# Patient Record
Sex: Female | Born: 1975
Health system: Southern US, Community
[De-identification: ages and names within clinical notes are randomized; demographics above are authoritative.]

---

## 2011-05-29 ENCOUNTER — Ambulatory Visit (INDEPENDENT_AMBULATORY_CARE_PROVIDER_SITE_OTHER): Payer: PRIVATE HEALTH INSURANCE | Admitting: Sports Medicine

## 2011-05-29 VITALS — BP 120/70 | Ht 64.0 in | Wt 125.0 lb

## 2011-05-29 DIAGNOSIS — M722 Plantar fascial fibromatosis: Secondary | ICD-10-CM | POA: Insufficient documentation

## 2011-05-29 NOTE — Assessment & Plan Note (Signed)
We will start her on exercises stretches and icing  Continue using the custom orthotics that were made for her as they look to be in good shape  use an arch strap  Restart activity but do this at about 50-60%  Level  Recheck in 2 mos

## 2011-05-29 NOTE — Progress Notes (Signed)
  Subjective:    Patient ID: Virginia Horne, female    DOB: 07/01/1975, 36 y.o.   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  She went to doctor 07/2010 who did steroid injection and put her in cam walker for 8 weeks.  She restarted zumba gradually with OTC insoles and heel pain returned.  Went back to Dr. Karle Barr 11/2010 did another CSI and 8 more weeks in cam walker.  Had custom orthotics made 12/2010- saw improvement initially, but now progressively worsening since late November of 2012.  She comes to see [place] for another opinion. She would like to get back to activity. She does not notice pain with horseback riding but does with walking up hills. She does not have much pain with regular walking during the day.   Review of Systems     Objective:   Physical Exam No acute distress. Normal weight and looks to be physically fit Neutral arch Slight splaying of toes 1-2, widening of forefoot AT normal bilat Good posterior tib function Leg lengths equal  Alignment good Good great toe motion bilat TTP on lt heel  Good abduction and rotation strength on lt Hip flexion strength good on lt Neutral walking gait  MSK ultrasound  The left plantar fascia shows a small spur There is hypoechoic change around this that is seen on both long and transverse view suggesting that it may be a partial tear of the fascia The left fascia is 0.67 thick versus 0.33 on the right The plantar fascia remains thick for 2 cm distal to the insertion        Assessment & Plan:

## 2011-05-29 NOTE — Patient Instructions (Signed)
Do PF stretches 2-3 times per day  Do heel raises 3 sets of 15 reps with knees straight and knees bent- twice daily  Ice baths are very good for the plantar fascia - try to do this for a total of 20 minutes per day  It is ok to start back to your normal activities at 1/2 intensity and time- if you are ok with this it is ok to gradually increase   Please avoid activities that require vigorous jumping on lt heel  Please follow up in 2 months  Thank you for seeing [place] today!

## 2011-07-26 ENCOUNTER — Ambulatory Visit (INDEPENDENT_AMBULATORY_CARE_PROVIDER_SITE_OTHER): Payer: PRIVATE HEALTH INSURANCE | Admitting: Sports Medicine

## 2011-07-26 VITALS — BP 109/76

## 2011-07-26 DIAGNOSIS — S7010XA Contusion of unspecified thigh, initial encounter: Secondary | ICD-10-CM | POA: Insufficient documentation

## 2011-07-26 DIAGNOSIS — M722 Plantar fascial fibromatosis: Secondary | ICD-10-CM

## 2011-07-26 DIAGNOSIS — T148XXA Other injury of unspecified body region, initial encounter: Secondary | ICD-10-CM

## 2011-07-26 NOTE — Progress Notes (Signed)
  Subjective:    Patient ID: Virginia Horne, female    DOB: [birth year], 36 y.o.   MRN: [id number]  HPI  Pt presents to clinic for f/u of Lt plantar fasciitis which she reports is 50-60% improved.  Compliant with home exercises and ice baths bid.  Using arch strap at all times, and her custom orthotics.  Was kicked by a horse in the lt quad 1 month ago.  Now has hard knot in that area of the quad which is tender to palpation.    Review of Systems  Constitutional: Negative for fever, chills, diaphoresis and fatigue.  Musculoskeletal: Negative for back pain, joint swelling, arthralgias and gait problem.  Neurological: Negative for tremors, weakness and numbness.       Objective:   Physical Exam  Constitutional: She is oriented to person, place, and time. She appears well-developed and well-nourished.       BP 109/76   Pulmonary/Chest: Effort normal.  Musculoskeletal:       Feet: Splaying b/t toes 2-3 on rt and lateral shift forefoot   Arches preserved, slight drop with standing Lt foot - slight hammering of lateral toes Slight soreness at insertion of PF  Good great toe motion bilat ATs non tender   Left Thigh: With ecchymosis in the anteromedial aspect of the thigh in the distal third. There is a induration area 4 x 4 centimeters in diameter in the area of trauma. Return as the patient in the indurated area. There is a resolving ecchymosis in the skin.  A full range of motion for the hip and knee joint. Neurovascularly intact.     Neurological: She is alert and oriented to person, place, and time.  Skin: Skin is warm. No rash noted. No erythema.  Psychiatric: She has a normal mood and affect. Her behavior is normal. Thought content normal.    MSK U/S :  Left plantar fascia measuring 0.61 cm. There is hypoechoic area in the deep attachment of the plantar fascia in the calcaneus. Recent mild bone spur in the calcaneus.  Left thigh: Posteromedial aspect of the distal third,  shows a resolving hematoma in the subcutaneous tissue, there is a 1 cm diameter cystic image. There are calcifications around the resolving hematoma. The VMO is intact.      Assessment & Plan:   1. Bruise of muscle   2. Quadriceps contusion   3. Plantar fasciitis    Procedure: After sterile preparation of the skin of the posteromedial distal third of the left thigh with topical Betadine, ethy lchloride and in  3 ml of 1% lidocaine were used for local anesthesia. Aspiration of the cystic accumulation on the left side was performed, aspiration of 0.5 mL of bloody content was done. Barbetage of the calcified area was preformed. Injection of 1 mL of 1% lidocaine, 10mg   of Kenalog is injected in the hematoma area. The procedure was done without any application. Well tolerated by the patient. The patient was alert about alarm sing. A compression sleeve was placed on the left by the patient after the procedure compression.  Recommended to take Motrin as antiinflammatory. She can restart her zumba class  in 1 week  F/U in 2 week

## 2011-07-26 NOTE — Progress Notes (Signed)
  Subjective:    Patient ID: Virginia Horne, female    DOB: 1975/12/26, 36 y.o.   MRN: [id number]  HPI  Pt presents to clinic for f/u of Lt plantar fasciitis which she reports is 50-60% improved. Compliant with home exercises and ice baths bid. Using arch strap at all times, and her custom orthotics.   Was kicked by a horse in the lt quad 1 month ago. Now has hard knot in that area of the quad.      Review of Systems     Objective:   Physical Exam  Splaying b/t toes 2-3 on rt and lateral shift forefoot  Arches preserved, slight drop with standing Lt foot - slight hammering of lateral toes Slight soreness at insertion of PF  Good great toe motion bilat ATs non tender       Assessment & Plan:

## 2011-08-09 ENCOUNTER — Encounter: Payer: Self-pay | Admitting: Sports Medicine

## 2011-08-09 ENCOUNTER — Ambulatory Visit (INDEPENDENT_AMBULATORY_CARE_PROVIDER_SITE_OTHER): Payer: PRIVATE HEALTH INSURANCE | Admitting: Sports Medicine

## 2011-08-09 VITALS — BP 124/75 | HR 72

## 2011-08-09 DIAGNOSIS — M722 Plantar fascial fibromatosis: Secondary | ICD-10-CM

## 2011-08-09 DIAGNOSIS — S7010XA Contusion of unspecified thigh, initial encounter: Secondary | ICD-10-CM

## 2011-08-09 NOTE — Patient Instructions (Signed)
Thank you for coming in today. Continue your exercises for both your foot and thigh.  You may slowly return to Zumba, but start with easy classes first.  You may ice your heel and your thigh as needed.  Avoid any activity that causes pain >3/10.  Let us know if you get worse.  Return in a few months to recheck your plantar fascia.

## 2011-08-09 NOTE — Progress Notes (Signed)
Virginia Horne is a 36 y.o. female who presents to [facility] today for   1) Followup left quadriceps contusion: 5-6 weeks ago she was kicked in the thigh by a juvenile horse.  She was seen at the sports medicine clinic approximately 2 weeks ago with a thigh contusion and on ultrasound was found to have partially loculated seroma.  This was treated by ultrasound-guided needleling and steroid injection.  Additionally she was provided with a Thigh sleeve and asked to reduce her activity level.  She notes that the mass on her thigh he has reduced and she does not have pain with her activities.  She is doing lunges and some other light activity and does not have pain.  2) she is also being followed for plantar fasciitis.  She was last seen in January for this issue and was found to have 0.6 cm plantar fascia with a small area of calcification.  She's been treated with Arch straps and eccentric calf strengthening exercises.  She has noted a reduction in her pain.  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   PMH reviewed. Significant for plantar fasciitis ROS as above otherwise neg Medications reviewed.  Exam:  BP 124/75  Pulse 72 Gen: Well NAD MSK:  Left thigh: small contusion present on the anterior thigh with 1 cm nodule in contusion.  Normal knee extension strength. Left heel: nontender over plantar heel.    Ultrasound examination Left thigh: small resolving seroma of approximately 0.5 cm in diameter located in the subcutaneous area on the anterior thigh.  Some native vessels surrounding the seroma but none inside.  No calcifications or significant fluid collections present.  Left heel: plantar fascia measures 0.5 cm.  Small area of calcification present with no significant fluid collection.

## 2011-08-09 NOTE — Assessment & Plan Note (Signed)
Improving with our straps and calf exercises.  Plan to continue home physical therapy and followup in 2-3 months or sooner if worsening.

## 2011-08-09 NOTE — Assessment & Plan Note (Signed)
Resolving status post guided needleling and steroid injection.  Plan to continue use of thigh sleeve and began to resume normal level of activity.  Asked her to reduce her activity should she experience pain.  Plan to followup in 2-3 months or sooner if worsening

## 2011-10-24 ENCOUNTER — Ambulatory Visit (INDEPENDENT_AMBULATORY_CARE_PROVIDER_SITE_OTHER): Payer: PRIVATE HEALTH INSURANCE | Admitting: Sports Medicine

## 2011-10-24 ENCOUNTER — Encounter: Payer: Self-pay | Admitting: Sports Medicine

## 2011-10-24 VITALS — BP 122/84 | HR 66 | Ht 64.0 in | Wt 125.0 lb

## 2011-10-24 DIAGNOSIS — M722 Plantar fascial fibromatosis: Secondary | ICD-10-CM

## 2011-10-24 DIAGNOSIS — M7710 Lateral epicondylitis, unspecified elbow: Secondary | ICD-10-CM

## 2011-10-24 DIAGNOSIS — M7711 Lateral epicondylitis, right elbow: Secondary | ICD-10-CM | POA: Insufficient documentation

## 2011-10-24 DIAGNOSIS — S7010XA Contusion of unspecified thigh, initial encounter: Secondary | ICD-10-CM

## 2011-10-24 NOTE — Assessment & Plan Note (Signed)
She reaggravated this but was doing very well  Return to the stretches and exercises as before. Keep using her orthotics for exercise. She continues to use an arch strap.

## 2011-10-24 NOTE — Assessment & Plan Note (Signed)
We will use a compression sleeve over her elbow  Given a series of rehabilitation exercises  Use Aleve as needed  I did not use nitroglycerin because she has a history of occasional migraine  Recheck in 6 weeks and reevaluate ultrasound for Doppler activity

## 2011-10-24 NOTE — Progress Notes (Signed)
  Subjective:    Patient ID: Virginia Horne, female    DOB: 10-Mar-1976, 36 y.o.   MRN: [id number]  HPI  Virginia Horne present with a f/u to plantar fasciitis in her left foot and a left quadricep contusion from getting kick by a horse. She was progressing nicely from her plantar fasciitis by performing her exercises and ice baths.  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 This initiated tingling in her left heel for the next hour. Prior to this event she felt 75% better. The pain is about a 1-2/10 but she states that she feels much better.   The contusion in her left quadricep is feeling better. It hasn't changed in size but the pain is mostly gone. She only cites a couple instances where it gives her a little bit of pain.   She presents today with a new condition of pain in her right epicondyle.  The pain has been occuring for the past month. She rates it as a 3-4/10. 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  She has been wearing a strap brace and that seems to help it.  She went to the beach last week and didn't perform any physical activities and it had been feeling better.   Review of Systems     Objective:   Physical Exam  NAD  Extremities: Left quadricep has some discoloration over the spot of the contusion.  It is nontender.  Left foot had full ROM.  Only slight tender to palpation over the calcaneous.   Right lateral epicondyle was tender to palpation.  Extension of the fingers and putting resistance against them elicited pain.  Resisted wrist extension also created pain Sensation of the right forearm was intact and strength was a 5/5  MSK ultrasound Right extensor tendons at the lateral epicondyle are intact There is a small area of hypoechoic change Doppler activity reveals numerous neovascular and inflammatory change  Left plantar fascia reveals a thickness of 0.6 which is slightly  decreased However there is a new area of hypoechoic change and it looks like a small split right at the insertion into the heel  Quadriceps hematoma is scanned and now shows no fluid but rather fibrotic change consistent with scar tissue      Assessment & Plan:

## 2011-10-24 NOTE — Assessment & Plan Note (Addendum)
This has healed but left some residual scar tissue where she had a hematoma.  Continue using thigh sleeve for her swelling as needed new one given today

## 2011-12-25 ENCOUNTER — Ambulatory Visit: Payer: PRIVATE HEALTH INSURANCE | Admitting: Sports Medicine

## 2012-01-15 ENCOUNTER — Ambulatory Visit: Payer: PRIVATE HEALTH INSURANCE | Admitting: Sports Medicine

## 2012-01-23 ENCOUNTER — Encounter: Payer: Self-pay | Admitting: Sports Medicine

## 2012-01-23 ENCOUNTER — Ambulatory Visit (INDEPENDENT_AMBULATORY_CARE_PROVIDER_SITE_OTHER): Payer: PRIVATE HEALTH INSURANCE | Admitting: Sports Medicine

## 2012-01-23 VITALS — BP 128/75 | HR 60

## 2012-01-23 DIAGNOSIS — M7711 Lateral epicondylitis, right elbow: Secondary | ICD-10-CM

## 2012-01-23 DIAGNOSIS — M25552 Pain in left hip: Secondary | ICD-10-CM | POA: Insufficient documentation

## 2012-01-23 DIAGNOSIS — M25559 Pain in unspecified hip: Secondary | ICD-10-CM

## 2012-01-23 DIAGNOSIS — M722 Plantar fascial fibromatosis: Secondary | ICD-10-CM

## 2012-01-23 DIAGNOSIS — S7010XA Contusion of unspecified thigh, initial encounter: Secondary | ICD-10-CM

## 2012-01-23 DIAGNOSIS — M7710 Lateral epicondylitis, unspecified elbow: Secondary | ICD-10-CM

## 2012-01-23 NOTE — Patient Instructions (Signed)
For you hip do the exercises shown on the sheet as well as  Sartorius exercises:  Use a 1-2lb ankle weight and bring the leg up and inwards like you are kicking a hacky sack.  Do 3 sets of 10 each day.  We will see you back in 6-8 weeks

## 2012-01-23 NOTE — Assessment & Plan Note (Signed)
Vastly improved,minimal pain. She is continuing to do exercises.

## 2012-01-23 NOTE — Progress Notes (Signed)
  Subjective:    Patient ID: Virginia Horne, female    DOB: [birth year], 36 y.o.   MRN: [id number]  HPI 1.Left hip pain: Complaint left anterior lateral hip pain. This has been intermittent since June after a trip to the beach.she states her pain is worse with prolonged walking or when starting walking from a sitting position. Aleve does help some of her pain. She denies any popping, catching of the hip. She does not have any pain down the back of her leg or her buttocks. She denies any numbness or tingling in the leg. 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  2. Right elbow pain: Continued intermittent right elbow pain. Currently not painful but had a recent flare over the weekend. Previously diagnosed with lateral epicondylitis. She has been using compression sleeve as well as Aleve for pain. She has not been compliant with her exercises or stretches.  Her quad contusion as well as her plantar fasciitis have vastly improved to the point she has minimal pain.   Review of Systems Per history of present illness    Objective:   Physical Exam Left Hip: ROM IR: 40 Deg, ER: 80 Deg, Flexion: 120 Deg, Extension: 100 Deg, Abduction: 45 Deg, Adduction: 45 Deg Strength IR: 5/5, ER: 5/5, Flexion: 5/5, Extension: 5/5, Abduction:4.5/5, Adduction: 5/5.  Strength 4/5 testing tensor fascia latae and sartorius Pelvic alignment unremarkable to inspection and palpation. Standing hip rotation and gait without trendelenburg / unsteadiness. Greater trochanter without tenderness to palpation. No tenderness over piriformis and greater trochanter. No SI joint tenderness and normal minimal SI movement.  Right arm: Normal to inspection and palpation. She has no tenderness at the lateral epicondyle. No pain with passive flexion or resisted wrist extension today.          Assessment & Plan:

## 2012-01-23 NOTE — Assessment & Plan Note (Signed)
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

## 2012-01-23 NOTE — Assessment & Plan Note (Addendum)
She has weakness of her sartorius as well as her tensor fascia lata muscle. I think her pain may be caused by tendinopathy,at the common insertion site of these two muscles. She was given exercises for rehabilitation of this muscle group. She'll followup in 6 weeks  [place] if not improved

## 2012-01-23 NOTE — Assessment & Plan Note (Signed)
resolved 

## 2016-05-24 ENCOUNTER — Encounter: Payer: Self-pay | Admitting: Sports Medicine

## 2016-05-24 ENCOUNTER — Ambulatory Visit (INDEPENDENT_AMBULATORY_CARE_PROVIDER_SITE_OTHER): Payer: BLUE CROSS/BLUE SHIELD | Admitting: Sports Medicine

## 2016-05-24 DIAGNOSIS — M5137 Other intervertebral disc degeneration, lumbosacral region: Secondary | ICD-10-CM | POA: Diagnosis not present

## 2016-05-24 MED ORDER — TRAMADOL HCL 50 MG PO TABS: 50.0000 mg | ORAL_TABLET | Freq: Three times a day (TID) | ORAL | 1 refills | Status: DC | PRN

## 2016-05-24 MED ORDER — GABAPENTIN 300 MG PO CAPS: 300.0000 mg | ORAL_CAPSULE | Freq: Three times a day (TID) | ORAL | 2 refills | Status: DC

## 2016-05-24 NOTE — Patient Instructions (Addendum)
Thanks for visiting the sports medicine clinic today.  You have been prescribed gabapentin and tramadol for your symptoms. We have made the following recommendations:  1) Gabapentin should be taken as follows: 300mg  nightly for 3 days, then increase to 300mg  twice daily for 3 days, then 300mg  three times a day. If after 10 days at the 300mg  three times a day dosing you have not seen significant improvement in your symptoms, please call the office for further instructions or additional increases in the medication. Remember, possible side effects are drowsiness, vivid dreams, or mood changes. If these side effects are intolerable, please call the office for further instructions.  2) For the tramadol (a pain medication), you may take it two-three times/day to control your pain.  3) We recommend that you walk 3-4times/day for 10-6515minutes or longer, but without increasing your pain. You may increase the duration as you can tolerate.  Follow-up with Dr. Darrick PennaFields in 6 weeks for reevaluation.

## 2016-05-24 NOTE — Assessment & Plan Note (Signed)
I think she deserves a trial of medications and conservative care Tramadol Gabapentin Walking  If not improving micro disc surgery at l2/3 is reasonable

## 2016-05-24 NOTE — Progress Notes (Signed)
Virginia Horne is a [age] y.o. female who is here for follow-up of ruptured discs.     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 Originally, in 1994, she was thrown from a horse and told she had a dislocated L4. Was out of school for 1 month but then returned to horseback riding. In 2008, she had her second major injury when she was thrown again from her horse, landing on her lower back. Was told then she had a "lower back compression," and did physical therapy until she could return to riding shortly thereafter. 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 In 2016, she remembers bending forward to pick something up and felt a sudden sharp lower back pain, and was unable to return to an upright position. Started seeing a chiropractor who did xrays and again told her that her lumbar region was compressed. Recommended pillows, stationary biking, and home exercises for management of the pain. At that time, she was able to complete regular daily activities and only had pain with heavy lifting or excess high-intensity exercise. Continued to compete with horseback riding, and in summer 2017 she was thrown off the horse again. No acute pain at that event, but says that since then, her pain has been worsening.   Now has daily pain, ranging from a dull ache to a sharp pain with radiation around her L hip and down her anterior and lateral L leg, to her foot. Had to stop riding in October as even walking while on horse was painful. Is having pain with daily activities such as carrying dishes or walking up stairs. Difficulties sleeping due to pain. Various treatments tried for current pain to include regular chiropractic visits, inversion table, home exercises, and OTC NSAIDs. Chiropractor sent for MRI which showed multiple bulging discs.  Went for evaluation by an orthopedic surgeon Dr. Clinton SawyerWilliamson,  who recommended a microdiscectomy.at L2/3  Med hx: neck pain, R ovary/tube removal Meds: Ibuprofen 800mg  (2-3times/wk), Advil PM nightly  ROS No weakness in lower extremties No bowel or bladder change No numbness  Physical Exam:  BP 104/70   Ht 5\' 4"  (1.626 m)   Wt 140 lb (63.5 kg)   BMI 24.03 kg/m   Gen: NAD, resting on exam table Neck: FROM, though discomfort down upper R back with L lateral flexion and L rotation. Back: Stiff lumbar spine, wants to move en bloc with forward flexion. Paraspinal muscle spasms in lumbar region. TTP at lower lumbar region. No SI tenderness. Discomfort with lumbar hyperextension but no reproduction of leg symptoms. Normal back rotation, but with discomfort produced in L lumbar region with left rotation. Negative straight leg test. Normal heel rise, post tib/gastroc strength. Normal DTRs.  Good hip strength (glut medius, maximus, TFL). Strength 5/5 UE and LE. Normal gait. Able to walk heel, toe , tandem  Images reviewed from [facility]:   Findings as noted in report but on my review only the L2/3 disk bulge is likely a true herniation that might cause her symptoms.  Enid BaasKarl Fields, MD     IMPRESSION:  1.Foraminal disc extrusion on the left at L2-3.  2.Small left central disc protrusion L3-4.  3.Small central disc protrusions L4-5 and L5-S1.  4.1.5 cm cystic-appearing lesion left lobe of liver. Recommend ultrasound for characterization.    MRI Lumbar Spine Wo Contrast (04/30/2016 5:25 PM)  Narrative  MRI lumbar spine:    INDICATION: Low back pain which radiates down the left leg.    TECHNIQUE: Sagittal and axial T1 and T2-weighted sequences were performed. Additional sagittal STIR images were performed.    COMPARISON: None    FINDINGS:  #Vertebral bodies: No compression fracture.  #Alignment: Normal.  #Marrow signal: Small vertebral body hemangioma in the superior aspect of the T12 vertebral body.   #Conus medullaris: Normal. Terminates at L1 with no evidence of tethering.  #Lower thoracic segments: No significant abnormality.  #Additional findings: 1.5 cm cystic-appearing lesion left lobe of liver. Recommend ultrasound of liver for confirmation and characterization.    #L1-2: Normal.  #L2-3: Mild degenerative disc disease. Foraminal disc extrusion on the left with impingement on the left side of the thecal sac and possibly the left L3 nerve root.  #L3-4: Moderate degenerative disc disease with disc space narrowing. Small left central disc protrusion.  #L4-5: Moderate degenerative disc disease with disc space narrowing. Moderate facet joint arthritis. Minimal central disc protrusion.  #L5-S1: Mild degenerative disc disease. Small central disc protrusion.        Assessment/Plan: 41yr old healthy female with low back pain with radiculopathy in the L2-L3 distribution c/w MRI images reviewed which showed disc protrusion at multiple lumbar levels, with possible impact of nerve at L2-L3. Her most significant symptom at this time is pain, and she has retained good strength in her lower extremities. -Will start gabapentin for radiculopathy, 300mg  QHS x 3 days, then 300mg  BID x 3 days, then 300mg  TID. If no improvement after 10days at TID dosing, could consider further increase if no significant side effects. Discussed possible side effects with patient including drowsiness, vivid dreams, or mood changes. -Start tramadol to 2-3 times/day for better pain control -Walk 3-4times/day for 10-70minutes with increased duration as tolerated. -If no significant improvement with above treatment, could reconsider surgical intervention (microdiscetomy), though would like to postpone surgery with her young age and high activity level. -Avoid horseback riding or aggravating activities  Follow-up: In 6wks for reevaluation  Annell Greening, MD  05/24/16

## 2016-06-21 ENCOUNTER — Telehealth: Payer: Self-pay | Admitting: *Deleted

## 2016-06-21 NOTE — Telephone Encounter (Signed)
Patient called saying she still is in some pain and not able to walk as much as she would like.  Has a follow up appt on Feb 22.  Will discuss these concerns and medication at that appt. Advised to let us know if she feels worse before then

## 2016-07-05 ENCOUNTER — Ambulatory Visit (INDEPENDENT_AMBULATORY_CARE_PROVIDER_SITE_OTHER): Payer: BLUE CROSS/BLUE SHIELD | Admitting: Sports Medicine

## 2016-07-05 ENCOUNTER — Encounter: Payer: Self-pay | Admitting: Sports Medicine

## 2016-07-05 DIAGNOSIS — M5137 Other intervertebral disc degeneration, lumbosacral region: Secondary | ICD-10-CM

## 2016-07-05 MED ORDER — TRAMADOL HCL 50 MG PO TABS: 50.0000 mg | ORAL_TABLET | Freq: Three times a day (TID) | ORAL | 1 refills | Status: DC | PRN

## 2016-07-05 MED ORDER — GABAPENTIN 300 MG PO CAPS: 300.0000 mg | ORAL_CAPSULE | Freq: Three times a day (TID) | ORAL | 2 refills | Status: DC

## 2016-07-05 NOTE — Assessment & Plan Note (Signed)
She does have a fragment at the L2-3 level on her MRI  I would like a neurosurgical opinion as to whether she might be a candidate for microdiscectomy She had seen an orthopedist who suggested this  I will continue her with conservative care I will steadily increase her gabapentin If she is not getting enough relief with medications surgical option would be advisable

## 2016-07-05 NOTE — Patient Instructions (Addendum)
Gabapentin  3 days - 300   300 600 3 days  600  300  600 7 days  600  600  600  7 days  600  600 900 7 days  900  600  900 7 days 900  900   900  If this works you might not need the surgery  Keep up tramadol 3 x day  We will get a Neurosurgery consult for possible microdiscectomy with Dr Lovell SheehanJenkins at [facility] on Tuesday March 6th at 930am. [address], [place], [place] [address]. [phone]

## 2016-07-05 NOTE — Progress Notes (Signed)
CC: Left hip and leg pain  Competitive equestrian Teaches Zumba  Patient with a lumbar spine injury from falling off her horse MRI revealed disc herniation at L2-3/ Dec. 2017 She also has some degenerative disc changes as noted on last visit  When she saw me 6 weeks ago we started her own gabapentin and tramadol She has about 30% improvement She can sleep through the night She has pain frequently during the day but not as intense  However, pain is worsened with prolonged sitting or with too much activity Sitting in a horse is very painful  Pain is felt mostly in the upper outer hip region Radiates down the lateral to the anterior thigh and then on day him to the medial ankle in an L3 distribution  Past history   plantar fasciitis Myositis ossificans in the quadriceps from getting kicked by horse  Review of systems Persistent pain deep in the left buttock from where she hit apost during the fall No radiating symptoms down the posterior thigh No numbness No cough or sneeze pain  Physical exam Pleasant female in no acute distress BP 116/71   Pulse 65   Ht 5\' 4"  (1.626 m)   Wt 140 lb (63.5 kg)   BMI 24.03 kg/m   Strength is preserved Hip abduction and adduction Hip flexion Quadriceps Hamstring  Reflexes at the patella and ankle are normal  She can do toe walk, heel walk and tandem walk She does have a mild limp with walking  Straight leg raise negative  firm nodule in deep buttocks Looks like a resolving hematoma on [place]

## 2016-08-09 ENCOUNTER — Other Ambulatory Visit: Payer: Self-pay | Admitting: *Deleted

## 2016-08-09 MED ORDER — GABAPENTIN 300 MG PO CAPS: ORAL_CAPSULE | ORAL | 2 refills | Status: DC

## 2016-08-14 ENCOUNTER — Other Ambulatory Visit: Payer: Self-pay | Admitting: *Deleted

## 2016-08-14 MED ORDER — TRAMADOL HCL 50 MG PO TABS: 50.0000 mg | ORAL_TABLET | Freq: Three times a day (TID) | ORAL | 3 refills | Status: DC | PRN

## 2016-09-13 ENCOUNTER — Ambulatory Visit: Payer: BLUE CROSS/BLUE SHIELD | Admitting: Sports Medicine

## 2016-10-09 ENCOUNTER — Ambulatory Visit: Payer: BLUE CROSS/BLUE SHIELD | Admitting: Sports Medicine

## 2016-10-18 ENCOUNTER — Ambulatory Visit (INDEPENDENT_AMBULATORY_CARE_PROVIDER_SITE_OTHER): Payer: BLUE CROSS/BLUE SHIELD | Admitting: Sports Medicine

## 2016-10-18 ENCOUNTER — Encounter: Payer: Self-pay | Admitting: Sports Medicine

## 2016-10-18 DIAGNOSIS — M5137 Other intervertebral disc degeneration, lumbosacral region: Secondary | ICD-10-CM | POA: Diagnosis not present

## 2016-10-18 NOTE — Progress Notes (Signed)
   Subjective:    Virginia Horne - 41 y.o. female MRN [id number]  Date of birth: 04/13/1976  CC: Low back pain   HPI: Virginia Horne is a 41 y/o female presenting for low back pain f/u. She suffered a lumbar spine injury (L2-L3 disc herniation) after falling off a horse. Since her last visit, she was evaluated by a neurosurgeon (Dr. Lovell SheehanJenkins) for possible microdiscectomy but they have elected to treat with conservative mgmt with tapered steroid dose and epidural injections x 2. Her last injection was about a month ago. Endorses significant improvement in pain from that. She is currently on gabapentin 900mg  TID and tramadol qd. Denies any side effects. Endorses pain alleviation by walking as well. She was able to get back on the horse and ride by walking it without significant pain.  Currently walking 1 mile per day  ROS: No radiation, numbness, or tingling. No cough or sneeze pain. No loss of bladder or bowel fxn. Negative except per HPI.  PMH: Plantar fascitis, myositis ossificans SH: Equestrian and teaches Zumba    Objective:   Physical Exam BP 106/64   Ht 5\' 4"  (1.626 m)   Wt 140 lb (63.5 kg)   BMI 24.03 kg/m  Gen: NAD, alert, cooperative with exam, well-appearing Skin: no rashes, normal turgor  Back: TTP over L2-L4 midline; negative SLR Hip: Normal abduction, adduction, and flexion Leg Strength: Quad 5/5; Hamstring 5/5; +2 patellar and achilles reflexes Able to perform toe walk, heel walk, and tandem walk Imbalance on single left leg stance on left w eyes closed  Assessment & Plan:   Virginia Horne is a 41 y/o female presenting for low back pain f/u.  L2-L3 disc herniation She has improvement of her symptoms from medication and epidural injections. Will continue conservative mgmt with core strengthening and stretching exercises. As she improves we will discuss reducing her gabapentin dose but will continue for now as she has no significant side effects from it. -Core strengthening  and stretching exercises -F/u in 6 weeks  I personally was present and performed or re-performed the history, physical exam and medical decision-making activities of this service and have verified that the service and findings are accurately documented in the student's note. Enid BaasKarl Kyliah Deanda, MD

## 2016-10-18 NOTE — Assessment & Plan Note (Signed)
Has improved with conservative care  Appreciate NS consult w Dr Lovell SheehanJenkins  Cont meds Increase activity  Follow and wean meds

## 2016-11-12 ENCOUNTER — Other Ambulatory Visit: Payer: Self-pay | Admitting: *Deleted

## 2016-11-12 MED ORDER — GABAPENTIN 300 MG PO CAPS: ORAL_CAPSULE | ORAL | 0 refills | Status: DC

## 2016-11-29 ENCOUNTER — Ambulatory Visit (INDEPENDENT_AMBULATORY_CARE_PROVIDER_SITE_OTHER): Payer: BLUE CROSS/BLUE SHIELD | Admitting: Sports Medicine

## 2016-11-29 DIAGNOSIS — M5137 Other intervertebral disc degeneration, lumbosacral region: Secondary | ICD-10-CM | POA: Diagnosis not present

## 2016-11-29 NOTE — Patient Instructions (Addendum)
It was a pleasure seeing you today in our clinic. Today we discussed your back pain. Here is the treatment plan we have discussed and agreed upon together:   - Continue performing the at-home exercise program provided at the previous visit. - We would like to taper your gabapentin dosing gradually over the next few weeks. Begin by reducing your midday dose by 300 mg. Continue this dose for one week. If you have no worsening in your symptoms you can reduce your morning time dose by 300 mg the following week. Continue this trend every week for as long as your symptoms remain under control. Do not change your nighttime gabapentin dose.  - We would like to have you follow up in 6-8 weeks.

## 2016-11-29 NOTE — Assessment & Plan Note (Signed)
Patient is here for follow-up on her lumbar back pain. Patient endorses good compliance with her exercise regimen and has had significant improvement in her symptom relief. She continues to have some more localized back pain along the L4-5 level no radiation of this pain through her legs. No red flag symptoms on exam. - Continue home strengthening exercises with significant focus on her core strength. Discussed opening her exercise regimen to other muscle groups with good form, high repetitions, and low weight. - Use pain as a guide. - Patient to slowly titrate gabapentin dosing down: Currently at 2700 mg daily. (Reduce morning or midday dose at 300 mg each week. Do not change p.m. dosing. Stop if symptom relief becomes uncontrolled) - Follow-up in 8 weeks.

## 2016-11-29 NOTE — Progress Notes (Signed)
   HPI  CC: Follow-up low back pain Patient is here to follow-up on her low back pain. Her last visit was on 10/18/16. Patient has a known degeneration of her lumbar disks suggestive of herniation. Patient has been progressing well with gradual symptom improvement. She states that she has been performing her home exercise regimen regularly and has been noticing some symptomatic improvement. Patient states that her back pain no longer radiates below the buttock. She states that she has more localized pain along the lumbar spine. Pain is worse with driving. She has been avoiding many of the activities that she typically enjoys but would eventually like to regain the ability to correct these activities (horseback riding). Patient continues to take gabapentin 3 times a day with good symptom relief. She states that she very rarely needs any tramadol for the pain. Patient denies any new injury, trauma, falls, rash, swelling, ecchymosis, fever, chills, numbness, weakness, paresthesias, saddle anesthesia, or bowel/bladder incontinence.  Medications/Interventions Tried: Tramadol, gabapentin, home PT  See HPI and/or previous note for associated ROS.  Objective: BP 120/82  Gen:  NAD, well groomed, a/o x3, normal affect.  CV: Well-perfused. Warm.  Resp: Non-labored.  Neuro: Sensation intact throughout. No gross coordination deficits.  Gait: Nonpathologic posture, unremarkable stride without signs of limp or balance issues. Back/Hips Exam: No obvious bony abnormality. No erythema, ecchymosis, or edema. TTP along the L4-5 spine and paraspinal muscles. No tenderness at the SI joints. No tenderness at the gluteal, piriformis, or hamstring origins. SLR negative. Negative FABER/FADIR. Good motion within the SI joints. Strength 5/5 bilaterally. Sensation intact bilaterally. +2 DTRs bilaterally.   Assessment and plan:  Degeneration of lumbar or lumbosacral intervertebral disc Patient is here for follow-up on her  lumbar back pain. Patient endorses good compliance with her exercise regimen and has had significant improvement in her symptom relief. She continues to have some more localized back pain along the L4-5 level no radiation of this pain through her legs. No red flag symptoms on exam. - Continue home strengthening exercises with significant focus on her core strength. Discussed opening her exercise regimen to other muscle groups with good form, high repetitions, and low weight. - Use pain as a guide. - Patient to slowly titrate gabapentin dosing down: Currently at 2700 mg daily. (Reduce morning or midday dose at 300 mg each week. Do not change p.m. dosing. Stop if symptom relief becomes uncontrolled) - Follow-up in 8 weeks.   Kathee DeltonIan D Heleena Miceli, MD,MS [facility] Fellow 11/29/2016 4:24 PM

## 2016-12-06 ENCOUNTER — Ambulatory Visit: Payer: BLUE CROSS/BLUE SHIELD | Admitting: Sports Medicine

## 2016-12-17 ENCOUNTER — Other Ambulatory Visit: Payer: Self-pay | Admitting: *Deleted

## 2016-12-17 MED ORDER — GABAPENTIN 300 MG PO CAPS: ORAL_CAPSULE | ORAL | 1 refills | Status: DC

## 2017-01-17 ENCOUNTER — Ambulatory Visit (INDEPENDENT_AMBULATORY_CARE_PROVIDER_SITE_OTHER): Payer: BLUE CROSS/BLUE SHIELD | Admitting: Sports Medicine

## 2017-01-17 ENCOUNTER — Encounter: Payer: Self-pay | Admitting: Sports Medicine

## 2017-01-17 DIAGNOSIS — M5137 Other intervertebral disc degeneration, lumbosacral region: Secondary | ICD-10-CM

## 2017-01-17 NOTE — Patient Instructions (Signed)
Virginia Horne, Virginia Horne for a follow up for your back pain.  Virginia appear to be improving despite your recent fall.   Dr. Darrick PennaFields is recommending the following exercises for Virginia:  Pelvic floor:  1. One legged wall squats (squeeze buttocks, switching legs) 2. kegels 3. Pelvic floor roll (w/ ball in between legs)  Back exercises:  1. Leg raises 2. Ab exercises 3. 15 min on bike, step ups and then some walking  Very nice to see Virginia please call the office with any concerns Kdyn Vonbehren L. Myrtie SomanWarden, MD [facility] Resident PGY-2 01/17/2017 11:43 AM

## 2017-01-17 NOTE — Progress Notes (Signed)
   [facility] [address] [place], [place] [address] Phone: [phone] Fax: [phone]   Patient Name: Virginia Horne Date of Birth: 07/21/1975 Medical Record Number: [id number] Gender: female Date of Encounter: 01/17/2017  History of Present Illness:  Virginia Horne is a 41 y.o. very pleasant female patient who presents today for follow-up of lower back pain. She was last seen on 11/29/2016. At that time she had no red flag symptoms on exam and thought to be making some progress. She was recommended to continue with strengthening exercises with focus on core strength. Additionally she was recommended to titrate down her gabapentin which she was taking 2700 mg daily at that time. She was cleared to begin some horseback riding at that time. Shortly after her last visit she had a fall off her horse on 12/10/2016. She was riding slowly and a deer ran out in front of her horse and she was thrown off and she fell on her left side. She denied any significant injuries at that time but was unable to continue titrating down her gabapentin due to pain and for period of time was not exercising as much as she would've liked to. Today she denies any significant lower back pain, numbness or tingling going down her extremities or weakness. She did note some loss of urine while riding her horse this past Sunday. This was new for her. She denies any fecal incontinence.   Past Medical, Surgical, Social, and Family History Reviewed. Medications and Allergies reviewed and all updated if necessary.  Review of Systems:  See history of present illness  Physical Examination: Vitals:   01/17/17 1103  BP: 100/64   Vitals:   01/17/17 1103  Weight: 140 lb (63.5 kg)  Height: 5\' 4"  (1.626 m)   Body mass index is 24.03 kg/m.  General: well appearing 41 year old female in NAD Cardiac: well perfused Resp: NWOB Back and hip exam: No gross deformity or edema. Minimally tender to  palpation at paraspinal muscles in L4-L5 region. No spinal tenderness and no tenderness in the SI joints. No tenderness at gluteal, piriformis or hamstrings. Straight leg raise negative on both sides. Good motion with SI joints bilaterally. Excellent strength testing in lower extremities Neuro: grossly normal, no changes in sensation, strength 5/5 throughout  Assessment and Plan: Degeneration of lumbar or lumbosacral intervertebral disc Patient is here today for follow-up of lower back pain. Did have a fall shortly after last visit that set her back with recovery. She denies any radicular symptoms at this time, but does have some urinary incontinence when she is riding her horse. This is unlikely to be due to any nerve compression as urine retention is more common. Urinary symptoms likely due to gabapentin use. Have provided patient with pelvic floor exercises to help with symptoms. Additionally recommending patient continue with back exercises including leg raises, ab exercises, stationary bike, step ups and walking. Would like to slowly titrate patient back with gabapentin. Follow-up in 8 weeks.   Daniel L. Myrtie SomanWarden, MD [facility] Resident PGY-2 01/17/2017 5:22 PM  I observed and examined the patient with the resident and agree with assessment and plan.  Note reviewed and modified by me. Enid BaasKarl Lillian Tigges, MD

## 2017-01-18 NOTE — Assessment & Plan Note (Signed)
Still doing well on gabapentin  Fall did not worsen her sxs  We will wean off gabapentin next 2 mos and increase exercise level

## 2017-03-07 ENCOUNTER — Ambulatory Visit: Payer: BLUE CROSS/BLUE SHIELD | Admitting: Sports Medicine

## 2017-03-12 ENCOUNTER — Ambulatory Visit: Payer: BLUE CROSS/BLUE SHIELD | Admitting: Sports Medicine

## 2017-03-19 ENCOUNTER — Ambulatory Visit: Payer: BLUE CROSS/BLUE SHIELD | Admitting: Sports Medicine

## 2017-04-25 ENCOUNTER — Encounter: Payer: Self-pay | Admitting: Sports Medicine

## 2017-04-25 ENCOUNTER — Ambulatory Visit (INDEPENDENT_AMBULATORY_CARE_PROVIDER_SITE_OTHER): Payer: BLUE CROSS/BLUE SHIELD | Admitting: Sports Medicine

## 2017-04-25 VITALS — BP 118/84 | Ht 64.0 in | Wt 133.0 lb

## 2017-04-25 DIAGNOSIS — M5137 Other intervertebral disc degeneration, lumbosacral region: Secondary | ICD-10-CM | POA: Diagnosis not present

## 2017-04-25 MED ORDER — GABAPENTIN 300 MG PO CAPS: ORAL_CAPSULE | ORAL | 1 refills | Status: DC

## 2017-04-25 MED ORDER — GABAPENTIN 300 MG PO CAPS: 300.0000 mg | ORAL_CAPSULE | Freq: Every day | ORAL | 1 refills | Status: DC

## 2017-04-25 MED ORDER — PREDNISONE 20 MG PO TABS: 20.0000 mg | ORAL_TABLET | Freq: Two times a day (BID) | ORAL | 0 refills | Status: DC

## 2017-04-25 NOTE — Patient Instructions (Addendum)
Restart gabapentin 300mg  at night. If after a couple weeks you need more relief can go to 600mg .  Prednisone 20mg  twice a day for 1 week  Yoga or Pilates or TaiChi would be helpful. Stretches to do at home are Knee to Chest, Knee to opposite shoulder.   Basic back exercises for home as per handout today.   Follow up in 6 weeks

## 2017-04-25 NOTE — Progress Notes (Signed)
   HPI  CC: Follow up low back pain  Here for follow up of her low back pain. Her last visit was on 01/17/17. Patient has known degeneration of lumbar discs suggestive of herniation. Since last visit had been trying to reintroduce light workouts which had started to cause some discomfort which acutely flared after several long car drives in September and October. Has now had worsening back pain with pain radiating down L leg to toes, rates 6/10, intermittent, present with daily activities such as sitting. Worse at night, sometimes has made it difficult for her to fall asleep. Has not been able to tolerate horseback riding. No new falls or trauma or injury. Had had 3 falls from horseback in her lifetime. No numbness or weakness, saddle anesthesia or bowel/bladder incontinence.  Medications/Interventions Tried: gabapentin though tapered off of it in October. Recalls prednisone being helpful last January/February. Recalls back injection at that time which were not helpful.  See HPI and/or previous note for associated ROS.  Objective: BP 118/84   Ht 5\' 4"  (1.626 m)   Wt 133 lb (60.3 kg)   BMI 22.83 kg/m  Gen:  NAD, well groomed, a/o x3, normal affect.  CV: Well-perfused. Warm.  Resp: Non-labored.  Neuro: Sensation intact throughout. No gross coordination deficits.  Gait: Nonpathologic posture, unremarkable stride without signs of limp or balance issues even on toe or heel walking. MSK: mildly TTP on L4-5 paraspinal area. Strength 5/5 in bilateral LE. Sensation intact. Neg straight leg.  Pelvis level with good ROM.  DTR patellar 2+ bilaterally.  Assessment and plan:  Degeneration of lumbar or lumbosacral intervertebral disc Here for follow up on lumbar back pain now worsened with resumption of radiation down lateral L leg likely re-exacerbated from recent activity and cessation of gabapentin. No red flag symptoms on history or exam. - Prednisone 20mg  BID for 1 week - Restart gabapentin 300mg   qhs, may need to titrate up to 600mg  qhs after a few weeks - Continue home strengthening exercises with focus on core strength and back stretches, reviewed today and given additional hand out today. Patient also encouraged to try yoga/pilates classes. - Follow up in 6 weeks.   Leland HerElsia J Yoo, DO PGY-2, [facility] 04/25/2017 9:25 AM   I observed and examined the patient with the resident and agree with assessment and plan.  Note reviewed and modified by me. Enid BaasKarl Loudon Krakow, MD

## 2017-04-25 NOTE — Assessment & Plan Note (Signed)
Here for follow up on lumbar back pain now worsened with resumption of radiation down lateral L leg likely re-exacerbated from recent activity and cessation of gabapentin. No red flag symptoms on history or exam. - Prednisone 20mg  BID for 1 week - Restart gabapentin 300mg  qhs, may need to titrate up to 600mg  qhs after a few weeks - Continue home strengthening exercises with focus on core strength and back stretches, reviewed today and given additional hand out today. Patient also encouraged to try yoga/pilates classes. - Follow up in 6 weeks.

## 2017-06-04 ENCOUNTER — Ambulatory Visit: Payer: BLUE CROSS/BLUE SHIELD | Admitting: Sports Medicine

## 2017-07-02 ENCOUNTER — Encounter: Payer: Self-pay | Admitting: Sports Medicine

## 2017-07-02 ENCOUNTER — Ambulatory Visit (INDEPENDENT_AMBULATORY_CARE_PROVIDER_SITE_OTHER): Payer: BLUE CROSS/BLUE SHIELD | Admitting: Sports Medicine

## 2017-07-02 VITALS — BP 99/48 | HR 67 | Ht 64.0 in | Wt 132.0 lb

## 2017-07-02 DIAGNOSIS — M5416 Radiculopathy, lumbar region: Secondary | ICD-10-CM | POA: Diagnosis not present

## 2017-07-02 MED ORDER — AMITRIPTYLINE HCL 25 MG PO TABS: ORAL_TABLET | ORAL | 1 refills | Status: DC

## 2017-07-02 MED ORDER — TRAMADOL HCL 50 MG PO TABS: 50.0000 mg | ORAL_TABLET | Freq: Two times a day (BID) | ORAL | 0 refills | Status: DC | PRN

## 2017-07-02 NOTE — Patient Instructions (Signed)
Continue gabapentin at night. Start Vitamin B6 100 mg daily.

## 2017-07-02 NOTE — Progress Notes (Signed)
Chief complaint:  Follow-up of acute on chronic low back pain times 16 months  History of present illness: Caylen is a 42 year old female who presents to sports medicine office today for follow-up of left lumbar radiculopathy symptoms.  She is a very active equestrian horse rider who lives in [place].  Unfortunately, she has sustained 3 traumatic falls off a horse. The first occurred in 1994, second occurring in 2008, most recently in July 2017.  She did have a MRI in November 2017 which showed foraminal disc extrusion on the left side of L2-3 with impingement on the left side of the thecal sac and potentially the left L3 nerve root.  She was also found to have moderate degenerative disc disease with small left central disc protrusion at L3-4, L4-5, and L5-S1.  She has been on a few courses of short burst of prednisone, as well as gabapentin, as high as 2700 mg daily.  She was weaned off of this.  She was most recently seen here on 04/25/17.  At that time, she was given the second prednisone burst, as well as restarting gabapentin, starting at 300 mg nightly and increasing up to 600 mg nightly if needed.  She was instructed to do some home exercises as well as doing yoga and Pilates.  Unfortunately, she reports since last office visit having worsening of symptoms.  She reports of having sharp shooting pains in her left lower back, radiating down along the front, on the side, and back side of her left leg down to her ankle and foot the left side.  She does not report of any bowel or bladder incontinence, does not report of any saddle anesthesia.  She does not report of any fevers, chills, night sweats, or any unintentional weight loss.  She reports any type of prolonged standing, walking, or back extension causes pain.  She does not report of any specific movements otherwise that she does that cause pain.  She notices increasing pain after doing yoga and Pilates.  She reports that she is currently on gabapentin 600 mg  nightly.  She reports not noticing much of a difference with either the prednisone or the gabapentin.  She reports that she is able to sleep a little bit longer at nighttime with the gabapentin.  She reports that she did do well on the 2700 mg gabapentin daily, however, she is hesitant about getting back to that point because of the high dose of medication that she was on.  She reports that it made her feel foggy and drowsy.  Review of systems:  As stated above  Interval past medical history, surgical history, family history, and social history obtained and unchanged. Medical history unremarkable. No surgeries on her back. No current tobacco use. Family history noncontributory. Refer to EMR  Physical exam: Vital signs are reviewed and are documented in the chart Gen.: Alert, oriented, appears stated age, in no apparent distress HEENT: Moist oral mucosa Respiratory: Normal respirations, able to speak in full sentences Cardiac: Regular rate, distal pulses 2+ Integumentary: No rashes on visible skin:  Neurologic: She actually does have good strength with hip abductor, quadriceps, hamstring, and glutes strength on both sides of, as well as knee flexion and knee extension strength, would categorize this as 5/5, sensation 2+ in bilateral lower extremity, DTR symmetric and intact, no evidence of foot drop Psych: Normal affect, mood is described as good Musculoskeletal: Inspection of her low back reveals no obvious deformity or muscle atrophy, no warmth, erythema, ecchymosis, or  effusion, she is tender to palpation along the midline of the lumbar spine as well as the paraspinal processes of the L-spine, she is also slightly tender to palpation over the left SI joint, as well as the gluteus maximus and gluteus medius on the left side, particularly over the sciatic notch, straight leg negative bilaterally, she does not have any pain with FABER, FADIR, logroll bilaterally, no Trendelenburg gait  appreciated  Assessment and plan: 1.  Left lumbar radiculopathy, clinical symptoms suggestive of L3 nerve root impingement as well as L5 nerve root impingement  Plan: Discussed with Nely that her symptoms seem to be consistent with continued left lumbar radiculopathy.  She has clinical symptoms suggestive of L3 nerve root impingement, as well as L5 nerve root impingement.  Discussed we will have her start on amitriptyline 25 mg nightly.  For now, we will have her continue gabapentin 600 mg nightly, but goal is to have her weaned off of this.  For breakthrough pain, will send in for short course of tramadol to use 2 to 3 times daily as needed pain. Will give her 16 tablets 0 refill.  In addition, we will have her start on vitamin B6 100 mg daily.  Discussed to avoid back extension.  In regards to horse riding, discussed that she can try short rides and see how she feels with this.  We will plan to see her back in 6 weeks for follow-up or sooner as needed.  Greater than 50% of 25 minutes was spent in direct counseling of patient regarding the above diagnosis, reviewing previous imaging findings, and coordination of care.  Haynes Kernshristopher Alethia Melendrez, M.D. Primary Care Sports Medicine Fellow [facility]

## 2017-07-03 ENCOUNTER — Other Ambulatory Visit: Payer: Self-pay | Admitting: *Deleted

## 2017-07-03 MED ORDER — AMITRIPTYLINE HCL 25 MG PO TABS: ORAL_TABLET | ORAL | 1 refills | Status: DC

## 2017-08-26 ENCOUNTER — Other Ambulatory Visit: Payer: Self-pay | Admitting: *Deleted

## 2017-08-26 MED ORDER — AMITRIPTYLINE HCL 25 MG PO TABS: ORAL_TABLET | ORAL | 1 refills | Status: DC

## 2017-09-17 ENCOUNTER — Ambulatory Visit: Payer: BLUE CROSS/BLUE SHIELD | Admitting: Sports Medicine

## 2017-10-08 ENCOUNTER — Encounter: Payer: Self-pay | Admitting: Sports Medicine

## 2017-10-08 ENCOUNTER — Ambulatory Visit (INDEPENDENT_AMBULATORY_CARE_PROVIDER_SITE_OTHER): Payer: BLUE CROSS/BLUE SHIELD | Admitting: Sports Medicine

## 2017-10-08 VITALS — BP 104/72 | Ht 64.0 in | Wt 132.0 lb

## 2017-10-08 DIAGNOSIS — M5416 Radiculopathy, lumbar region: Secondary | ICD-10-CM

## 2017-10-08 NOTE — Progress Notes (Signed)
Chief complaint: Follow-up of acute on chronic low back pain with left-sided radiculopathy x19 months  History of present illness: Virginia Horne is a 42 year old female who presents to sports medicine office today for follow-up of low back pain with left lumbar radiculopathy symptoms.  Symptoms have been present for nearly 20 months now.  She was last here about 3 months ago back on 07/02/2017.  At that time, given worsening of pain she was started on amitriptyline 25 mg nightly.  She was also advised to continue with the gabapentin 600 mg nightly as well as tramadol for breakthrough pain.  She was also advised to do short horseback riding as well as yoga and Pilates.  Of note, she is a very active equestrian horse rider, has sustained 3 falls off a horse.  Most recent MRI November 2017 showed foraminal disc extrusion on the left side of L2-3 with impingement on the left side of the thecal sac and potentially the left L3 nerve root.  She was also found to have moderate degenerative disc disease of a small left central disc protrusion at L3-4, L4-5, L5-S1.  This is all been managed conservatively.  Fortunately today, she does report of interval improvement in symptoms. She has only had to use the gabapentin and tramadol on rare occasion. She reports about 6 weeks ago she started doing short, 15-minute horseback rides once a week.  She has not had any pain with this.  She has been doing yoga and Pilates without any pain.  She only notices left lateral leg pain going down to the knee when she is riding in the car for prolonged periods of time.  Otherwise, she does not have any pain.  No medication side effects noted.  She does not report of any bowel or bladder incontinence, does not report of any saddle anesthesia.  She does not report of any fevers, chills, night sweats, or any unintentional weight loss.  Review of systems:  As stated above  Interval past medical history, surgical history, family history, and social  history obtained and unchanged. Her past medical history unremarkable. No surgeries on her back. No current tobacco use. Family history noncontributory.  Allergies and medications are reviewed and are reflected in EMR.   Physical exam: Vital signs are reviewed and are documented in the chart Gen.: Alert, oriented, appears stated age, in no apparent distress HEENT: Moist oral mucosa Respiratory: Normal respirations, able to speak in full sentences Cardiac: Regular rate, distal pulses 2+ Integumentary: No rashes on visible skin:  Neurologic: Similar to last time, she does have great strength with hip abductor, hip flexion, hip extension, quadriceps, hamstring strength on both sides, would categorize these strengths as 5/5, sensation 2+ in bilateral lower extremity, DTRs symmetric and intact in bilateral lower extremities, no evidence of foot drop or antalgic gait Psych: Normal affect, mood is described as good Musculoskeletal: Inspection of her low back reveals no obvious deformity or muscle atrophy, no warmth, erythema, ecchymosis, or effusion, no tenderness to palpation today along anywhere the lumbar spine as well as the paraspinal lumbar processes, she only has slight tenderness over the rectus femoris in the left anterior hip about half a centimeter down from its insertion on the AIIS, she does have full range of motion with hip flexion, hip abduction, hip extension, knee flexion, knee extension, straight leg, slump, FABER, FADIR and logroll negative  Assessment and plan: 1.  Left lumbar radiculopathy, with clinical symptoms suggestive of L3 nerve root impingement as well as L5  nerve root impingement, doing better today  Plan: Discussed with Taunya that she seems to be doing well.  Suspect that her hip flexors, specifically the rectus femoris is a little bit strained with the rehab, yoga, and Pilates.  Discussed hip flexion and hip abduction exercises to do at home.  In terms of medication  regimen, discussed to continue with amitriptyline 25 mg nightly.  For gabapentin and tramadol, only use for breakthrough pain.  She can gradually progress in terms of horseback riding.  Will plan to see her back in about 3 to 4 months or sooner as needed.  Haynes Kerns, M.D. Primary Care Sports Medicine Fellow [facility]  I observed and examined the patient with the [facility] Fellow and agree with assessment and plan.  Note reviewed and modified by me. Enid Baas, MD

## 2017-10-08 NOTE — Patient Instructions (Signed)
Continue amitriptyline 25 mg nightly. Use gabapentin and tramadol as needed for pain. Follow up in 3-4 months.

## 2017-10-27 ENCOUNTER — Other Ambulatory Visit: Payer: Self-pay | Admitting: Sports Medicine

## 2017-10-27 DIAGNOSIS — M5137 Other intervertebral disc degeneration, lumbosacral region: Secondary | ICD-10-CM

## 2017-10-29 ENCOUNTER — Other Ambulatory Visit: Payer: Self-pay | Admitting: Sports Medicine

## 2017-11-05 ENCOUNTER — Other Ambulatory Visit: Payer: Self-pay | Admitting: *Deleted

## 2017-11-05 MED ORDER — AMITRIPTYLINE HCL 25 MG PO TABS: ORAL_TABLET | ORAL | 1 refills | Status: DC

## 2018-01-28 ENCOUNTER — Other Ambulatory Visit: Payer: Self-pay | Admitting: Sports Medicine

## 2018-01-28 DIAGNOSIS — M5137 Other intervertebral disc degeneration, lumbosacral region: Secondary | ICD-10-CM

## 2018-05-06 ENCOUNTER — Other Ambulatory Visit: Payer: Self-pay | Admitting: *Deleted

## 2018-05-06 DIAGNOSIS — M5137 Other intervertebral disc degeneration, lumbosacral region: Secondary | ICD-10-CM

## 2018-05-06 MED ORDER — GABAPENTIN 300 MG PO CAPS: 300.0000 mg | ORAL_CAPSULE | Freq: Every day | ORAL | 0 refills | Status: DC

## 2018-07-22 ENCOUNTER — Ambulatory Visit (INDEPENDENT_AMBULATORY_CARE_PROVIDER_SITE_OTHER): Payer: BLUE CROSS/BLUE SHIELD | Admitting: Sports Medicine

## 2018-07-22 ENCOUNTER — Encounter: Payer: Self-pay | Admitting: Sports Medicine

## 2018-07-22 VITALS — BP 124/70 | Ht 64.0 in | Wt 140.0 lb

## 2018-07-22 DIAGNOSIS — M542 Cervicalgia: Secondary | ICD-10-CM

## 2018-07-22 DIAGNOSIS — M5416 Radiculopathy, lumbar region: Secondary | ICD-10-CM

## 2018-07-22 DIAGNOSIS — M5137 Other intervertebral disc degeneration, lumbosacral region: Secondary | ICD-10-CM | POA: Diagnosis not present

## 2018-07-22 MED ORDER — GABAPENTIN 300 MG PO CAPS: 300.0000 mg | ORAL_CAPSULE | Freq: Every day | ORAL | 0 refills | Status: DC

## 2018-07-22 MED ORDER — GABAPENTIN 300 MG PO CAPS: ORAL_CAPSULE | ORAL | 6 refills | Status: DC

## 2018-07-22 MED ORDER — PREDNISONE 20 MG PO TABS: 20.0000 mg | ORAL_TABLET | Freq: Two times a day (BID) | ORAL | 0 refills | Status: DC

## 2018-07-22 NOTE — Assessment & Plan Note (Signed)
-  Acute on chronic problem -MRI findings from 2017 as above -Recommend a home exercise program for this which was detailed to the patient. -20 mg prednisone twice daily x7 days.  Counseled on common and severe side effects of the medication. -Gabapentin 600 to 900 mg nightly -If not improving, recommend restarting amitriptyline versus repeat MRI of her lumbar spine

## 2018-07-22 NOTE — Assessment & Plan Note (Signed)
-  No neurologic symptoms.  Appears to be musculoskeletal related -Recommend upper trapezius and sternocleidomastoid exercises to be completed daily.  These will be both stretching and strengthening exercises -Heating pad to the neck as needed -She may receive some benefit from the prednisone Dosepak that she was placed on for her low back in terms of anti-inflammatory relief of her neck muscles -If persistent pain, recommend an x-ray of her cervical spine at next visit

## 2018-07-22 NOTE — Progress Notes (Signed)
Virginia Horne - 43 y.o. female MRN [id number]  Date of birth: 1976/01/19   Chief complaint: Neck pain new, and follow-up low back pain with left-sided radiculopathy  SUBJECTIVE:    History of present illness: 43 year old female who presents today with a chief complaint of left-sided radiculopathy which is worsening and new onset neck pain.  For her low back, she previously had an MRI in November 2017 showing foraminal disc extrusion on the left L2-L3 with impingement of the left thecal sac.  She also is found to have degenerative disc disease at L3-L4, L4-L5, and L5-S1.  She was treated with a home exercise program, gabapentin, and amitriptyline with resolution of her symptoms.  She states last October however, she started increasing her training regimen with heavier weight lifting and more horse back riding and exacerbated her pain.  Currently her pain is described as a burning sensation over the left lateral leg into the dorsum of her foot.  It is worse with prolonged exercise.  It improves with relative rest from activity.  She is no longer exercising and has actually put on 10 pounds because of her pain.  She also had stopped taking her gabapentin and amitriptyline several months ago after feeling much better.  Since then, she has titrated her gabapentin back up to 600 to 900 mg nightly due to her symptoms.  She states her symptom severity is 6 out of 10 and is worse with activity.  It does wake her up at night.  She has tried 800 mg of Motrin 3 times daily for the past 2 weeks without any relief of her current symptoms.  She is also here complaining of neck pain.  This is been there for approximately 3 weeks. Started after sleeping funny while on road trip.   It is worse since she started trotting while horseback riding.  Her neck is primarily painful on the left side in the upper trapezius and sternocleidomastoid muscles.  It is worse with positional movements like rotation to the right and side  bending to the right.  It is also worse when she raises her arm up over her head.  The ibuprofen does not seem to help with her symptoms.  She is also tried stretching and heating without any significant relief.  She denies any radicular symptoms into her left or right arms.  No loss of grip strength or upper extremity weakness.   Review of systems:  A complete review of systems was performed and pertinent positives and negatives discussed above in the HPI.   Interval past medical history, surgical history, family history, and social history obtained and are unchanged.   History of degenerative disc disease in her lumbar spine as well as a lumbar radiculopathy on the left.  Medications reviewed and unchanged.  Of note, she is on gabapentin 600 to 900 mg at night.  She is not taking her Elavil. Allergies reviewed and unchanged.  Of note she has allergies to doxycycline.  OBJECTIVE:  Physical exam: Vital signs are reviewed. BP 124/70   Ht 5\' 4"  (1.626 m)   Wt 140 lb (63.5 kg)   BMI 24.03 kg/m   Gen.: Alert, oriented, appears stated age, in no apparent distress HEENT: Moist oral mucosa Respiratory: Normal respirations, able to speak in full sentences Cardiac: Regular rate, distal pulses 2+ Integumentary: No rashes  Neck: No midline tenderness to palpation.  Tenderness palpation in her sternocleidomastoid and left upper trapezius muscle.  Restricted range of motion in neck rotation to  the right and side bending to the right.  Negative Spurling's test. Neurologic:  Negative straight leg raise test on the left.  She endorses numbness of the lateral aspect of her left thigh lower leg and dorsum of her foot.  Strength testing is 5 out of 5 of the left lower extremity.  Reflexes L4 is +2, S1 is +2 Gait: normal without associated limp Psych: Normal affect, mood is described as good Musculoskeletal: Inspection of the left lower extremity demonstrates no obvious abnormality.  No tenderness palpation  over the greater trochanter.  Full range of motion.  Strength testing intact as above.  Negative logroll test.  Negative Faber test.  Negative Fadir testing.  Neurovascularly intact.    ASSESSMENT & PLAN: Acute left lumbar radiculopathy -Acute on chronic problem -MRI findings from 2017 as above -Recommend a home exercise program for this which was detailed to the patient. -20 mg prednisone twice daily x7 days.  Counseled on common and severe side effects of the medication. -Gabapentin 600 to 900 mg nightly. Refill given today. -If not improving, recommend restarting amitriptyline versus repeat MRI of her lumbar spine  Neck pain -No neurologic symptoms.  Appears to be musculoskeletal related -Recommend upper trapezius and sternocleidomastoid exercises to be completed daily.  These will be both stretching and strengthening exercises -Heating pad to the neck as needed -She may receive some benefit from the prednisone Dosepak that she was placed on for her low back in terms of anti-inflammatory relief of her neck muscles -If persistent pain, recommend an x-ray of her cervical spine at next visit   Meds ordered this encounter  Medications  . predniSONE (DELTASONE) 20 MG tablet    Sig: Take 1 tablet (20 mg total) by mouth 2 (two) times daily.    Dispense:  14 tablet    Refill:  0  . gabapentin (NEURONTIN) 300 MG capsule    Sig: Take 1 capsule (300 mg total) by mouth at bedtime.    Dispense:  90 capsule    Refill:  0      Gustavus Messing, DO Sports Medicine Fellow [place]  I observed and examined the patient with Dr. Corrinne Eagle and agree with assessment and plan.  Note reviewed and modified by me. Sterling Big, MD

## 2018-07-22 NOTE — Patient Instructions (Signed)
Neck exercises  Easy motion in each direction and circular Isometrics in 4 directions - resist x count of 10 and repeat 5 times Shake outs for trapezius tightness - non painful  Ice massage 5 mins. 3 times per day  Low back/ Sciatica Prednisone to settle down sciatica for 1 weeks Crunches in table top in 3 positions - build to doing 100 Pelvic tilt/tummy tucks isometic tight x 10 repeat x 5  Knee to chest - hold x 10 x 5 Knee to shoulder - "  : Both knees to chest and rock and roll

## 2018-12-16 ENCOUNTER — Ambulatory Visit: Payer: BC Managed Care – PPO | Admitting: Sports Medicine

## 2019-01-30 DIAGNOSIS — R109 Unspecified abdominal pain: Secondary | ICD-10-CM | POA: Diagnosis not present

## 2019-01-30 DIAGNOSIS — M7918 Myalgia, other site: Secondary | ICD-10-CM | POA: Diagnosis not present

## 2019-01-30 DIAGNOSIS — R1012 Left upper quadrant pain: Secondary | ICD-10-CM | POA: Diagnosis not present

## 2019-02-17 ENCOUNTER — Other Ambulatory Visit: Payer: Self-pay | Admitting: Sports Medicine

## 2019-02-17 DIAGNOSIS — M5137 Other intervertebral disc degeneration, lumbosacral region: Secondary | ICD-10-CM

## 2019-03-10 DIAGNOSIS — Z1151 Encounter for screening for human papillomavirus (HPV): Secondary | ICD-10-CM | POA: Diagnosis not present

## 2019-03-10 DIAGNOSIS — Z124 Encounter for screening for malignant neoplasm of cervix: Secondary | ICD-10-CM | POA: Diagnosis not present

## 2019-03-10 DIAGNOSIS — Z01419 Encounter for gynecological examination (general) (routine) without abnormal findings: Secondary | ICD-10-CM | POA: Diagnosis not present

## 2019-03-17 DIAGNOSIS — Z20828 Contact with and (suspected) exposure to other viral communicable diseases: Secondary | ICD-10-CM | POA: Diagnosis not present

## 2019-04-14 DIAGNOSIS — L814 Other melanin hyperpigmentation: Secondary | ICD-10-CM | POA: Diagnosis not present

## 2019-04-14 DIAGNOSIS — L821 Other seborrheic keratosis: Secondary | ICD-10-CM | POA: Diagnosis not present

## 2019-04-14 DIAGNOSIS — L719 Rosacea, unspecified: Secondary | ICD-10-CM | POA: Diagnosis not present

## 2019-04-14 DIAGNOSIS — L209 Atopic dermatitis, unspecified: Secondary | ICD-10-CM | POA: Diagnosis not present

## 2019-05-28 DIAGNOSIS — Z1231 Encounter for screening mammogram for malignant neoplasm of breast: Secondary | ICD-10-CM | POA: Diagnosis not present

## 2019-09-25 ENCOUNTER — Other Ambulatory Visit: Payer: Self-pay | Admitting: Sports Medicine

## 2019-09-25 DIAGNOSIS — M5137 Other intervertebral disc degeneration, lumbosacral region: Secondary | ICD-10-CM

## 2019-09-28 ENCOUNTER — Other Ambulatory Visit: Payer: Self-pay | Admitting: *Deleted

## 2019-09-28 DIAGNOSIS — M5137 Other intervertebral disc degeneration, lumbosacral region: Secondary | ICD-10-CM

## 2019-09-28 MED ORDER — GABAPENTIN 300 MG PO CAPS: ORAL_CAPSULE | ORAL | 2 refills | Status: DC

## 2019-12-26 ENCOUNTER — Other Ambulatory Visit: Payer: Self-pay | Admitting: Sports Medicine

## 2019-12-26 DIAGNOSIS — M5137 Other intervertebral disc degeneration, lumbosacral region: Secondary | ICD-10-CM

## 2020-03-04 ENCOUNTER — Other Ambulatory Visit: Payer: Self-pay | Admitting: Sports Medicine

## 2020-03-04 DIAGNOSIS — M5137 Other intervertebral disc degeneration, lumbosacral region: Secondary | ICD-10-CM

## 2020-03-10 DIAGNOSIS — Z124 Encounter for screening for malignant neoplasm of cervix: Secondary | ICD-10-CM | POA: Diagnosis not present

## 2020-03-10 DIAGNOSIS — Z1151 Encounter for screening for human papillomavirus (HPV): Secondary | ICD-10-CM | POA: Diagnosis not present

## 2020-03-10 DIAGNOSIS — Z01419 Encounter for gynecological examination (general) (routine) without abnormal findings: Secondary | ICD-10-CM | POA: Diagnosis not present

## 2020-03-18 ENCOUNTER — Other Ambulatory Visit: Payer: Self-pay

## 2020-03-18 ENCOUNTER — Ambulatory Visit (INDEPENDENT_AMBULATORY_CARE_PROVIDER_SITE_OTHER): Payer: BC Managed Care – PPO | Admitting: Family Medicine

## 2020-03-18 ENCOUNTER — Ambulatory Visit
Admission: RE | Admit: 2020-03-18 | Discharge: 2020-03-18 | Disposition: A | Discharge status: Home / Self Care | Payer: BC Managed Care – PPO | Source: Ambulatory Visit | Attending: Family Medicine | Admitting: Family Medicine

## 2020-03-18 ENCOUNTER — Encounter: Payer: Self-pay | Admitting: Family Medicine

## 2020-03-18 ENCOUNTER — Ambulatory Visit: Payer: BC Managed Care – PPO | Admitting: Family Medicine

## 2020-03-18 VITALS — BP 102/72 | Ht 64.0 in | Wt 135.0 lb

## 2020-03-18 DIAGNOSIS — M5116 Intervertebral disc disorders with radiculopathy, lumbar region: Secondary | ICD-10-CM | POA: Diagnosis not present

## 2020-03-18 DIAGNOSIS — M5416 Radiculopathy, lumbar region: Secondary | ICD-10-CM

## 2020-03-18 DIAGNOSIS — M4319 Spondylolisthesis, multiple sites in spine: Secondary | ICD-10-CM | POA: Diagnosis not present

## 2020-03-18 DIAGNOSIS — M4726 Other spondylosis with radiculopathy, lumbar region: Secondary | ICD-10-CM | POA: Diagnosis not present

## 2020-03-18 MED ORDER — PREDNISONE 10 MG PO TABS: ORAL_TABLET | ORAL | 0 refills | Status: DC

## 2020-03-18 NOTE — Progress Notes (Signed)
[facility]: Attending Note: I have reviewed the chart, discussed wit the Sports Medicine Fellow. I agree with assessment and treatment plan as detailed in the Fellow's note. Symptoms of left-sided low back pain and left radiculopathy in the L4-5 and the L3-4 distribution. Both of these symptoms have gotten worse in the last few months. We reviewed her MRI from 2017. At this point she would consider epidural or facet joint injection and might potentially consider surgical intervention as this is keeping her from doing many of her activities and she is having daily pain. She is also having difficulty sleeping at night secondary to pain. We will try a steroid burst and will increase her gabapentin. MRI will be set up and she will follow up after that. She will follow-up or call us sooner if she has any new or worsening symptoms and we discussed red flags.

## 2020-03-18 NOTE — Progress Notes (Signed)
   PCP: Leverne Humbles, MD  Subjective:   HPI: Patient is a 44 y.o. female here for follow-up on worsening left-sided radiculopathy She previously had an MRI in November 2017 showing foraminal disc extrusion on the left L2-L3 with impingement of the left thecal sac.  She also was found to have degenerative disc disease at L3-L4, L4-L5, and L5-S1.  In thousand 17, she did have some rounds of epidural steroid injections for the disc extrusion which were somewhat helpful.  Since then, she has been managed conservatively with home exercises, gabapentin, and occasionally prednisone Dosepaks.  Unfortunately, patient has had worsening of her radicular symptoms down her left leg and significant worsening pain in her left low back.  Previously, she is not having very much pain in her back, her symptoms are mostly radicular in nature.  The pain is worsened after horseback riding, and now she is unable to ride due to the pain.  She is currently having pain with most activities.  She does not have any notable leg weakness, but does continue to have radicular symptoms.  No saddle anesthesia, no bowel or bladder incontinence.  Review of Systems:  Per HPI.   PMFSH, medications and smoking status reviewed.      Objective:  Physical Exam:  [facility] 03/18/2020  Frequency of aerobic exercise (# of days/week) 0  Average time in minutes 0  Frequency of strengthening activities (# of days/week) 0     Gen: awake, alert, NAD, comfortable in exam room Pulm: breathing unlabored  Lumbar spine:  Inspection: No evidence of erythema, ecchymosis, swelling edema.  Palpation: Positive paraspinal muscular tenderness extending into the upper glued on the left.  No midline spinal tenderness. Nontender to facets. Nontender to SI joints.  ROM: Intact to forward flexion, extension, rotation, and bending.  She does have pain especially with extension. Special tests: Mildly positive straight leg  raise bilaterally with reproduction of symptoms on the left.   Neuro: 2+ patellar and Achilles reflexes bilaterally, equal bilaterally Neurovascular intact distally   Assessment & Plan:  1.  Low back pain with lumbar radiculopathy  Patient with acute worsening of her chronic low back pain.  She has known degenerative disc disease and disc extrusion at L2-3, however she is having worsening symptoms.  I am concerned about further disc extrusion versus spondylolysis/spondylolisthesis versus facet arthropathy.   Plan: -Start prednisone Dosepak. -Increase gabapentin to 1200 mg nightly, may add midday or morning 300 mg dose as well if she can tolerate this. -We will obtain lumbar MRI for better characterization and consideration of epidural steroid injections. -We will call patient with results of MRI and discussion of further planning.   Guy Sandifer, MD [facility] Fellow 03/18/2020 9:24 AM

## 2020-03-18 NOTE — Patient Instructions (Addendum)
Thank you for coming in to see Virginia Horne today! Please see below to review our plan for today's visit:   1. Please increase your gabapentin to 1200 mg at night, you may also add a dose in the evening if you continue to have pain. 2.   Please start prednisone Dosepak. 3.   You should get a call to set up an MRI of your lumbar spine, we will call you after the results of this to discuss further planning.    Please call the clinic at [phone] if your symptoms worsen or you have any concerns. It was our pleasure to serve you.       Dr. Guy Sandifer [facility]

## 2020-03-21 ENCOUNTER — Other Ambulatory Visit: Payer: Self-pay | Admitting: *Deleted

## 2020-03-21 ENCOUNTER — Encounter: Payer: Self-pay | Admitting: *Deleted

## 2020-03-21 DIAGNOSIS — M5137 Other intervertebral disc degeneration, lumbosacral region: Secondary | ICD-10-CM

## 2020-03-21 MED ORDER — PREDNISONE 10 MG PO TABS: ORAL_TABLET | ORAL | 0 refills | Status: DC

## 2020-03-21 MED ORDER — GABAPENTIN 300 MG PO CAPS: ORAL_CAPSULE | ORAL | 0 refills | Status: DC

## 2020-03-21 MED ORDER — GABAPENTIN 300 MG PO CAPS: 900.0000 mg | ORAL_CAPSULE | Freq: Every day | ORAL | 0 refills | Status: DC

## 2020-03-22 ENCOUNTER — Encounter: Payer: Self-pay | Admitting: Family Medicine

## 2020-03-22 NOTE — Progress Notes (Signed)
  DR. Dwayna Kentner'S PERSONAL SUMMARY OF THESE TEST RESULTS  FOR YOU: The formal report from your X ray is below

## 2020-04-01 ENCOUNTER — Other Ambulatory Visit: Payer: Self-pay

## 2020-04-01 ENCOUNTER — Ambulatory Visit (INDEPENDENT_AMBULATORY_CARE_PROVIDER_SITE_OTHER): Payer: BC Managed Care – PPO | Admitting: Family Medicine

## 2020-04-01 DIAGNOSIS — M5416 Radiculopathy, lumbar region: Secondary | ICD-10-CM | POA: Diagnosis not present

## 2020-04-01 MED ORDER — GABAPENTIN 300 MG PO CAPS: ORAL_CAPSULE | ORAL | 1 refills | Status: DC

## 2020-04-01 MED ORDER — GABAPENTIN 300 MG PO CAPS
ORAL_CAPSULE | ORAL | 1 refills | Status: DC
Start: 2020-04-01 — End: 2020-04-01

## 2020-04-01 MED ORDER — TRAMADOL HCL 50 MG PO TABS
ORAL_TABLET | ORAL | 0 refills | Status: DC
Start: 2020-04-01 — End: 2020-04-25

## 2020-04-01 NOTE — Progress Notes (Signed)
° ° °  CHIEF COMPLAINT / HPI: Worsening of her low back pain.  Her MRIs not scheduled until next week.  She had to go on a business trip where she was riding in a car for 3 hours and she thinks that has made it much worse.  Her symptoms are in the same area and unchanged except for intensity.  She has been taking the gabapentin as prescribed and that seemed to help some.  She is having difficulty getting through a full day of work right now because she cannot find a comfortable position sitting standing or moving around in the afternoons.  When she had this issue previously, she had used some tramadol.  She completed the steroid Dosepak and does not think it helped much this time.  Potentially it was going to help but then she had exacerbation due to her travel.  Her main issue is just getting comfortable enough to put in a full day at work.  She wants to save her time in case she ends up having to have some type of surgical intervention.   PERTINENT  PMH / PSH: I have reviewed the patients medications, allergies, past medical and surgical history, smoking status and updated in the EMR as appropriate.   OBJECTIVE:  BP 110/80    Ht 5\' 4"  (1.626 m)    Wt 135 lb (61.2 kg)    BMI 23.17 kg/m  GENERAL: Well-developed female no acute distress.  She does seem somewhat uncomfortable sitting for any extended period of time.  ASSESSMENT / PLAN:   Acute left lumbar radiculopathy I think the worsening of her symptoms is probably related to the natural course of degenerative disc disease combined with the prolonged car trip.  I would not retry the prednisone burst.  We will treat her with some pain medication and increase her gabapentin.  She will try to call and see if she can get her MRI a little bit earlier.  I will follow up after MRI.  If she has worsening symptoms in the interim, she will call the office immediately. 30 minutes total time for visit. MD

## 2020-04-01 NOTE — Assessment & Plan Note (Signed)
I think the worsening of her symptoms is probably related to the natural course of degenerative disc disease combined with the prolonged car trip.  I would not retry the prednisone burst.  We will treat her with some pain medication and increase her gabapentin.  She will try to call and see if she can get her MRI a little bit earlier.  I will follow up after MRI.  If she has worsening symptoms in the interim, she will call the office immediately.

## 2020-04-01 NOTE — Patient Instructions (Addendum)
Increasing your gabapentin can be done several ways Lets try moving up to 5 at bedtime, one in afternoon. Other increases would be to either add one in AM, or move up to 2 in afternoon  I will also call in some tramadol  You cold check with MRI and see if they have a cancellation list as well Call us if it gets worse  I sent in the tramadol

## 2020-04-05 ENCOUNTER — Ambulatory Visit
Admission: RE | Admit: 2020-04-05 | Discharge: 2020-04-05 | Disposition: A | Discharge status: Home / Self Care | Payer: BC Managed Care – PPO | Source: Ambulatory Visit | Attending: Family Medicine | Admitting: Family Medicine

## 2020-04-05 DIAGNOSIS — M545 Low back pain, unspecified: Secondary | ICD-10-CM | POA: Diagnosis not present

## 2020-04-05 DIAGNOSIS — M5416 Radiculopathy, lumbar region: Secondary | ICD-10-CM

## 2020-04-05 DIAGNOSIS — M48061 Spinal stenosis, lumbar region without neurogenic claudication: Secondary | ICD-10-CM | POA: Diagnosis not present

## 2020-04-11 ENCOUNTER — Telehealth: Payer: Self-pay

## 2020-04-11 NOTE — Telephone Encounter (Signed)
Aundra Millet The report says no change. I looked at images and it may or may not LOOK changed, but it certainly looks like it is the problem. I would recommend at least seeing the neurosurgeon. I have interviews this afternoon but I can call her at [phone] ish if she wants My bottom line is to make an appt with [facility] tho Let me know if she wants me to call [facility]! Denny Levy

## 2020-04-11 NOTE — Telephone Encounter (Signed)
-----   Message from Virginia Horne [facility] sent at 04/11/2020  2:05 PM EST ----- Asking for a call back regarding her MRI results.

## 2020-04-11 NOTE — Telephone Encounter (Signed)
Discussed w pt She is agreeable to [facility] eval Has had epidural steroids before and is also agreeable to considering those again Answered her questions Denny Levy

## 2020-04-12 NOTE — Progress Notes (Signed)
Discussed via phone with pt Will set up [facility] evaluation

## 2020-04-13 DIAGNOSIS — L814 Other melanin hyperpigmentation: Secondary | ICD-10-CM | POA: Diagnosis not present

## 2020-04-13 DIAGNOSIS — L821 Other seborrheic keratosis: Secondary | ICD-10-CM | POA: Diagnosis not present

## 2020-04-13 DIAGNOSIS — D225 Melanocytic nevi of trunk: Secondary | ICD-10-CM | POA: Diagnosis not present

## 2020-04-13 DIAGNOSIS — D1801 Hemangioma of skin and subcutaneous tissue: Secondary | ICD-10-CM | POA: Diagnosis not present

## 2020-04-22 ENCOUNTER — Other Ambulatory Visit: Payer: Self-pay | Admitting: Family Medicine

## 2020-04-22 DIAGNOSIS — M4316 Spondylolisthesis, lumbar region: Secondary | ICD-10-CM | POA: Diagnosis not present

## 2020-04-25 ENCOUNTER — Telehealth: Payer: Self-pay

## 2020-04-25 NOTE — Telephone Encounter (Signed)
Ok see if they can fax [place] the form THANKS! Denny Levy

## 2020-04-29 DIAGNOSIS — M5459 Other low back pain: Secondary | ICD-10-CM | POA: Diagnosis not present

## 2020-04-29 DIAGNOSIS — M5442 Lumbago with sciatica, left side: Secondary | ICD-10-CM | POA: Diagnosis not present

## 2020-05-03 DIAGNOSIS — M5459 Other low back pain: Secondary | ICD-10-CM | POA: Diagnosis not present

## 2020-05-03 DIAGNOSIS — M5442 Lumbago with sciatica, left side: Secondary | ICD-10-CM | POA: Diagnosis not present

## 2020-05-09 DIAGNOSIS — M5459 Other low back pain: Secondary | ICD-10-CM | POA: Diagnosis not present

## 2020-05-09 DIAGNOSIS — M5442 Lumbago with sciatica, left side: Secondary | ICD-10-CM | POA: Diagnosis not present

## 2020-05-11 DIAGNOSIS — M5442 Lumbago with sciatica, left side: Secondary | ICD-10-CM | POA: Diagnosis not present

## 2020-05-11 DIAGNOSIS — M5459 Other low back pain: Secondary | ICD-10-CM | POA: Diagnosis not present

## 2020-05-17 DIAGNOSIS — M5442 Lumbago with sciatica, left side: Secondary | ICD-10-CM | POA: Diagnosis not present

## 2020-05-17 DIAGNOSIS — M5459 Other low back pain: Secondary | ICD-10-CM | POA: Diagnosis not present

## 2020-05-20 DIAGNOSIS — M5442 Lumbago with sciatica, left side: Secondary | ICD-10-CM | POA: Diagnosis not present

## 2020-05-20 DIAGNOSIS — M5459 Other low back pain: Secondary | ICD-10-CM | POA: Diagnosis not present

## 2020-05-23 DIAGNOSIS — M5442 Lumbago with sciatica, left side: Secondary | ICD-10-CM | POA: Diagnosis not present

## 2020-05-23 DIAGNOSIS — M5459 Other low back pain: Secondary | ICD-10-CM | POA: Diagnosis not present

## 2020-05-26 ENCOUNTER — Other Ambulatory Visit: Payer: Self-pay | Admitting: Family Medicine

## 2020-05-26 MED ORDER — GABAPENTIN 300 MG PO CAPS: ORAL_CAPSULE | ORAL | 1 refills | Status: AC

## 2020-06-03 DIAGNOSIS — M5442 Lumbago with sciatica, left side: Secondary | ICD-10-CM | POA: Diagnosis not present

## 2020-06-03 DIAGNOSIS — M5459 Other low back pain: Secondary | ICD-10-CM | POA: Diagnosis not present

## 2020-06-08 ENCOUNTER — Other Ambulatory Visit: Payer: Self-pay | Admitting: Sports Medicine

## 2020-06-08 DIAGNOSIS — M5459 Other low back pain: Secondary | ICD-10-CM | POA: Diagnosis not present

## 2020-06-08 DIAGNOSIS — M5442 Lumbago with sciatica, left side: Secondary | ICD-10-CM | POA: Diagnosis not present

## 2020-06-12 ENCOUNTER — Other Ambulatory Visit: Payer: Self-pay | Admitting: Sports Medicine

## 2020-06-20 DIAGNOSIS — Z1231 Encounter for screening mammogram for malignant neoplasm of breast: Secondary | ICD-10-CM | POA: Diagnosis not present

## 2020-06-24 DIAGNOSIS — M5442 Lumbago with sciatica, left side: Secondary | ICD-10-CM | POA: Diagnosis not present

## 2020-06-24 DIAGNOSIS — M5459 Other low back pain: Secondary | ICD-10-CM | POA: Diagnosis not present

## 2020-06-28 DIAGNOSIS — M5459 Other low back pain: Secondary | ICD-10-CM | POA: Diagnosis not present

## 2020-06-28 DIAGNOSIS — M5442 Lumbago with sciatica, left side: Secondary | ICD-10-CM | POA: Diagnosis not present

## 2020-06-29 DIAGNOSIS — R002 Palpitations: Secondary | ICD-10-CM | POA: Diagnosis not present

## 2020-06-29 DIAGNOSIS — N83292 Other ovarian cyst, left side: Secondary | ICD-10-CM | POA: Diagnosis not present

## 2020-06-29 DIAGNOSIS — E049 Nontoxic goiter, unspecified: Secondary | ICD-10-CM | POA: Diagnosis not present

## 2020-06-29 DIAGNOSIS — N926 Irregular menstruation, unspecified: Secondary | ICD-10-CM | POA: Diagnosis not present

## 2020-06-29 DIAGNOSIS — R102 Pelvic and perineal pain: Secondary | ICD-10-CM | POA: Diagnosis not present

## 2020-06-29 DIAGNOSIS — D259 Leiomyoma of uterus, unspecified: Secondary | ICD-10-CM | POA: Diagnosis not present

## 2020-07-04 DIAGNOSIS — Z8489 Family history of other specified conditions: Secondary | ICD-10-CM | POA: Diagnosis not present

## 2020-07-04 DIAGNOSIS — E041 Nontoxic single thyroid nodule: Secondary | ICD-10-CM | POA: Diagnosis not present

## 2020-07-05 DIAGNOSIS — R102 Pelvic and perineal pain: Secondary | ICD-10-CM | POA: Diagnosis not present

## 2020-07-05 DIAGNOSIS — D259 Leiomyoma of uterus, unspecified: Secondary | ICD-10-CM | POA: Diagnosis not present

## 2020-07-06 DIAGNOSIS — M5459 Other low back pain: Secondary | ICD-10-CM | POA: Diagnosis not present

## 2020-07-06 DIAGNOSIS — M5442 Lumbago with sciatica, left side: Secondary | ICD-10-CM | POA: Diagnosis not present

## 2020-07-20 DIAGNOSIS — M5459 Other low back pain: Secondary | ICD-10-CM | POA: Diagnosis not present

## 2020-07-20 DIAGNOSIS — M5442 Lumbago with sciatica, left side: Secondary | ICD-10-CM | POA: Diagnosis not present

## 2020-08-03 DIAGNOSIS — M5442 Lumbago with sciatica, left side: Secondary | ICD-10-CM | POA: Diagnosis not present

## 2020-08-03 DIAGNOSIS — M5459 Other low back pain: Secondary | ICD-10-CM | POA: Diagnosis not present

## 2020-08-15 DIAGNOSIS — M5442 Lumbago with sciatica, left side: Secondary | ICD-10-CM | POA: Diagnosis not present

## 2020-08-15 DIAGNOSIS — M5459 Other low back pain: Secondary | ICD-10-CM | POA: Diagnosis not present

## 2020-08-16 DIAGNOSIS — M9904 Segmental and somatic dysfunction of sacral region: Secondary | ICD-10-CM | POA: Diagnosis not present

## 2020-08-16 DIAGNOSIS — M7062 Trochanteric bursitis, left hip: Secondary | ICD-10-CM | POA: Diagnosis not present

## 2020-08-31 DIAGNOSIS — M533 Sacrococcygeal disorders, not elsewhere classified: Secondary | ICD-10-CM | POA: Diagnosis not present

## 2020-08-31 DIAGNOSIS — M7062 Trochanteric bursitis, left hip: Secondary | ICD-10-CM | POA: Diagnosis not present

## 2020-08-31 DIAGNOSIS — M48061 Spinal stenosis, lumbar region without neurogenic claudication: Secondary | ICD-10-CM | POA: Diagnosis not present

## 2020-10-03 DIAGNOSIS — M533 Sacrococcygeal disorders, not elsewhere classified: Secondary | ICD-10-CM | POA: Diagnosis not present

## 2020-10-31 DIAGNOSIS — M533 Sacrococcygeal disorders, not elsewhere classified: Secondary | ICD-10-CM | POA: Diagnosis not present

## 2020-10-31 DIAGNOSIS — M48061 Spinal stenosis, lumbar region without neurogenic claudication: Secondary | ICD-10-CM | POA: Diagnosis not present

## 2020-11-02 DIAGNOSIS — Z20822 Contact with and (suspected) exposure to covid-19: Secondary | ICD-10-CM | POA: Diagnosis not present

## 2020-11-02 DIAGNOSIS — R509 Fever, unspecified: Secondary | ICD-10-CM | POA: Diagnosis not present

## 2020-11-02 DIAGNOSIS — J029 Acute pharyngitis, unspecified: Secondary | ICD-10-CM | POA: Diagnosis not present

## 2021-01-05 DIAGNOSIS — M533 Sacrococcygeal disorders, not elsewhere classified: Secondary | ICD-10-CM | POA: Diagnosis not present

## 2021-02-23 DIAGNOSIS — M48061 Spinal stenosis, lumbar region without neurogenic claudication: Secondary | ICD-10-CM | POA: Diagnosis not present

## 2021-02-23 DIAGNOSIS — M533 Sacrococcygeal disorders, not elsewhere classified: Secondary | ICD-10-CM | POA: Diagnosis not present

## 2021-02-23 DIAGNOSIS — M7062 Trochanteric bursitis, left hip: Secondary | ICD-10-CM | POA: Diagnosis not present

## 2021-10-24 ENCOUNTER — Other Ambulatory Visit: Payer: Self-pay | Admitting: Orthopedic Surgery

## 2021-10-24 DIAGNOSIS — G8929 Other chronic pain: Secondary | ICD-10-CM

## 2021-11-13 ENCOUNTER — Ambulatory Visit
Admission: RE | Admit: 2021-11-13 | Discharge: 2021-11-13 | Disposition: A | Discharge status: Home / Self Care | Payer: BC Managed Care – PPO | Source: Ambulatory Visit | Attending: Orthopedic Surgery | Admitting: Orthopedic Surgery

## 2021-11-13 DIAGNOSIS — M545 Low back pain, unspecified: Secondary | ICD-10-CM

## 2021-11-30 IMAGING — MR MR LUMBAR SPINE W/O CM
4 of 5 series · 25 of 48 positions shown · non-contrast
Comparison: 04/30/2016

CLINICAL DATA: Low back pain and left leg pain

EXAM:
MRI LUMBAR SPINE WITHOUT CONTRAST
TECHNIQUE: Multiplanar, multisequence MR imaging of the lumbar spine was
performed. No intravenous contrast was administered.

[Series 3: T2 post-contrast · sagittal · 4.0mm · 0.53mm/px · 6 of 16 slices shown]
[im 1/16]
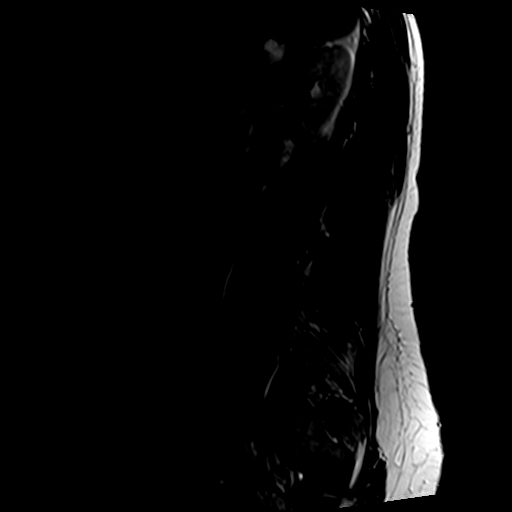
[im 4/16]
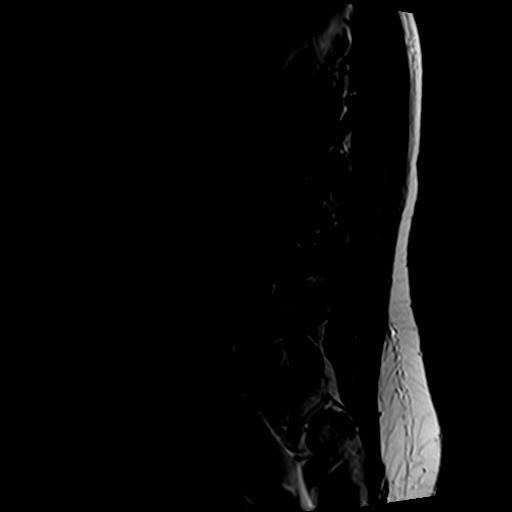
[im 7/16]
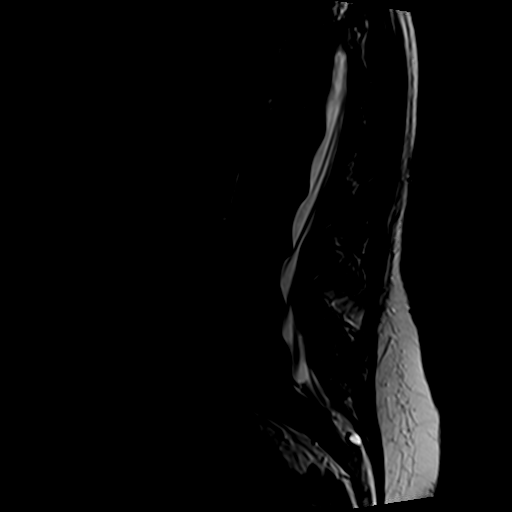
[im 10/16]
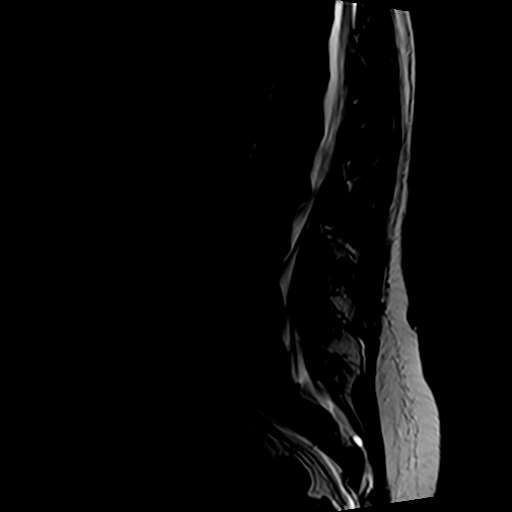
[im 13/16]
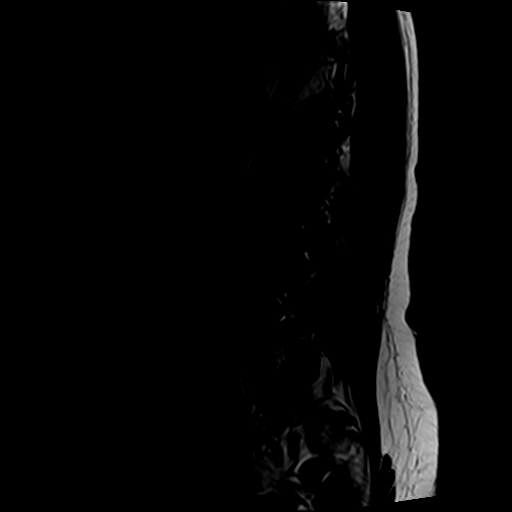
[im 16/16]
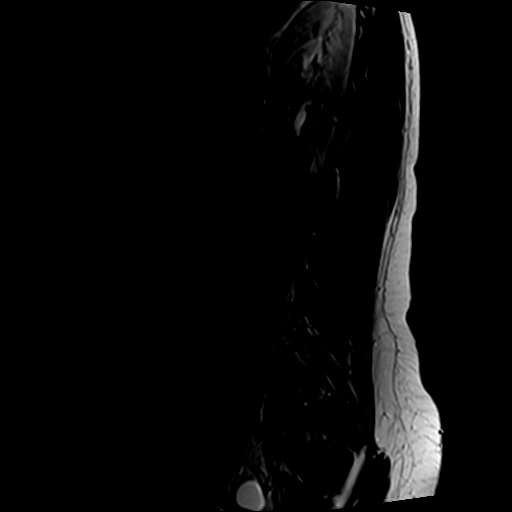

[Series 5: T1 · sagittal · 4.0mm · 0.53mm/px · 7 of 16 slices shown (1 of 2)]
[im 1/16]
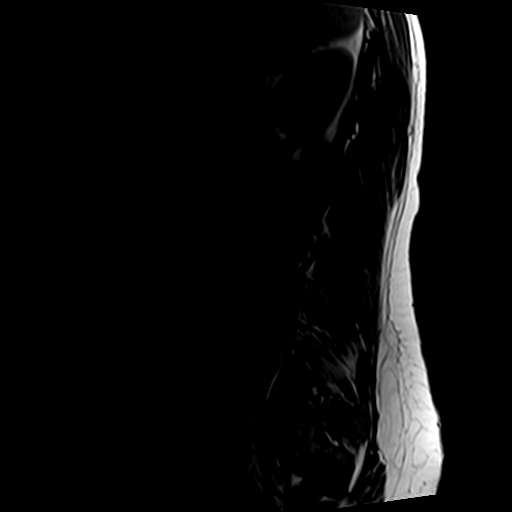
[im 3/16]
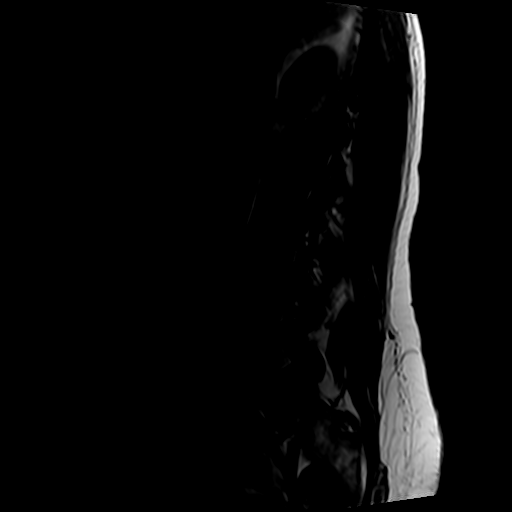
[im 6/16]
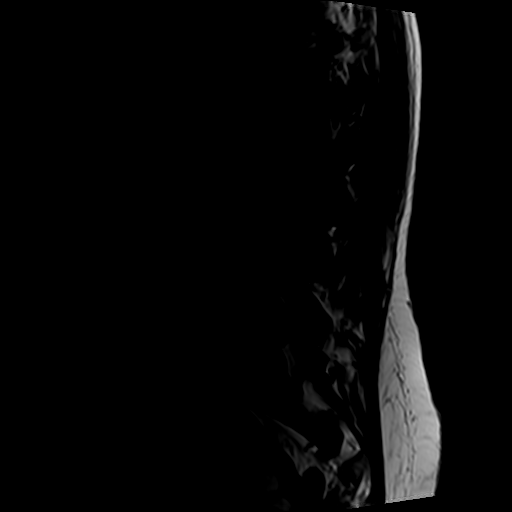
[im 8/16]
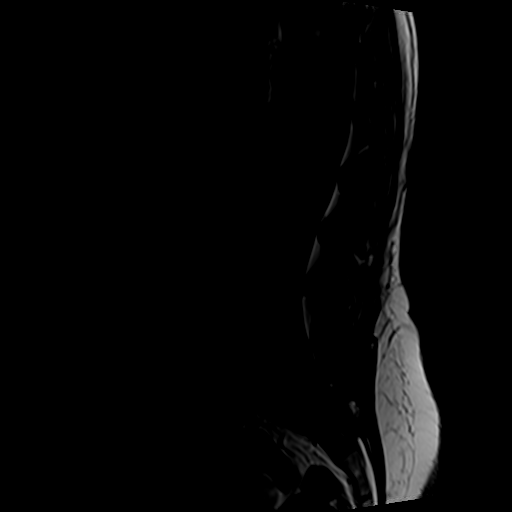
[im 11/16]
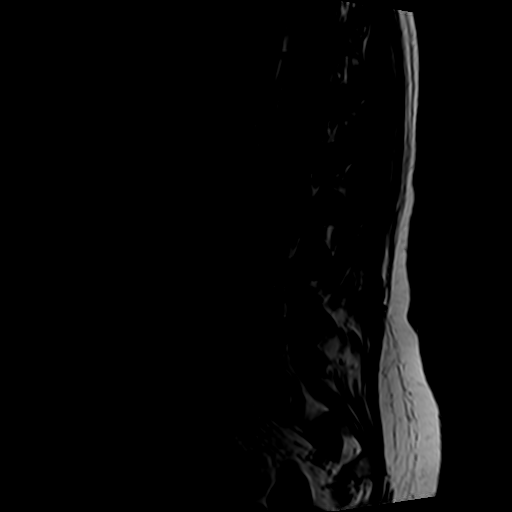
[im 13/16]
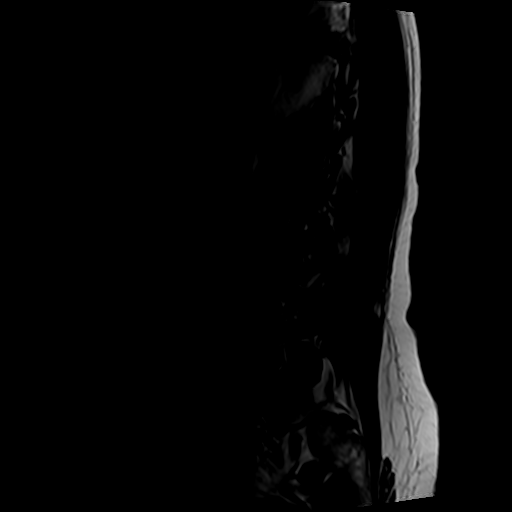
[im 16/16]
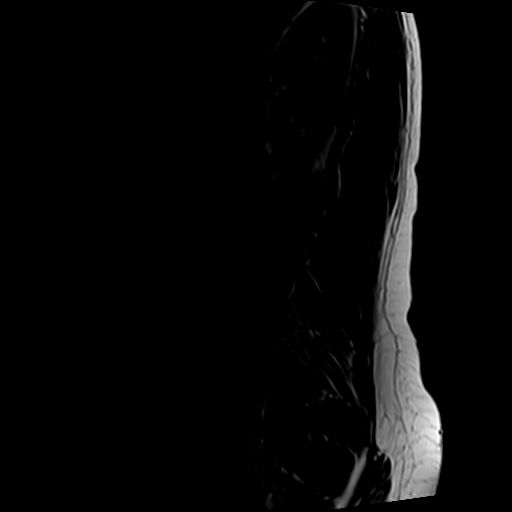

[Series 6: T2 · axial · 4.0mm · 0.70mm/px · z∈[-39,+158]mm · 8 of 35 slices shown]
[im 1/35]
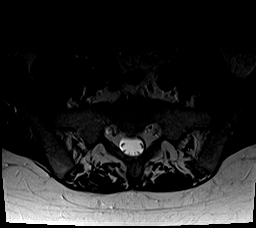
[im 6/35]
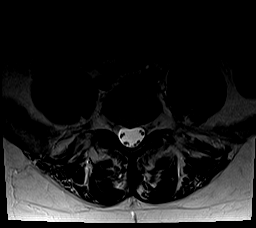
[im 11/35]
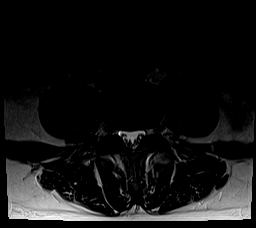
[im 16/35]
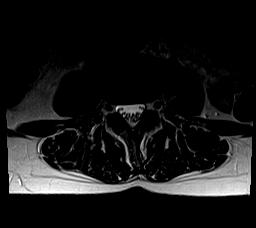
[im 19/35]
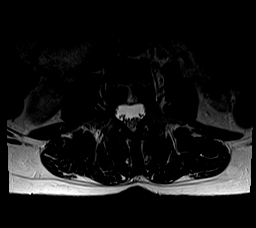
[im 24/35]
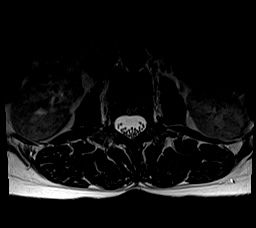
[im 29/35]
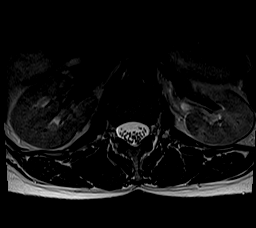
[im 35/35]
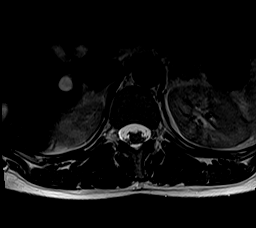

[Series 7: T1 · axial · 4.0mm · 0.35mm/px · z∈[-39,+127]mm · 4 of 35 slices shown (2 of 2)]
[im 1/35]
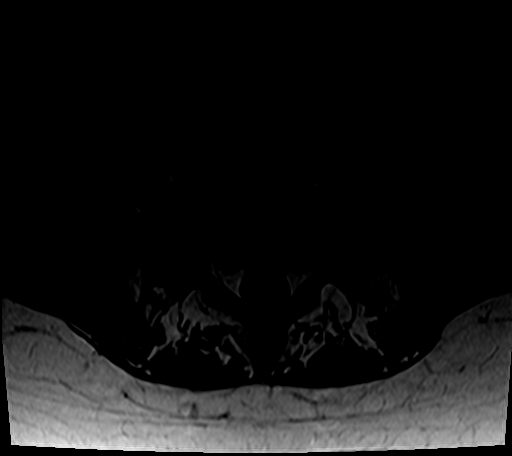
[im 6/35]
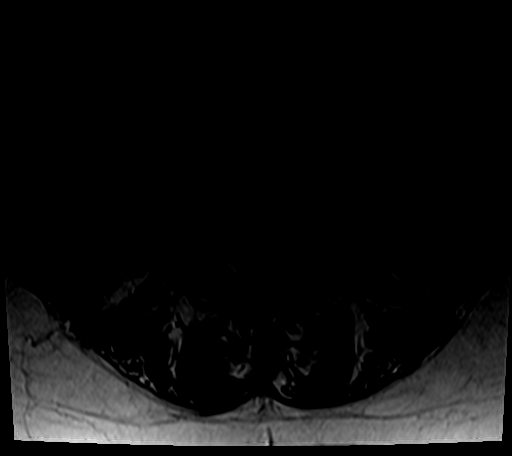
[im 19/35]
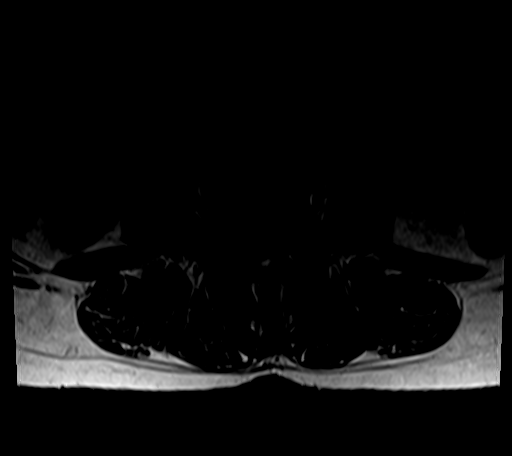
[im 29/35]
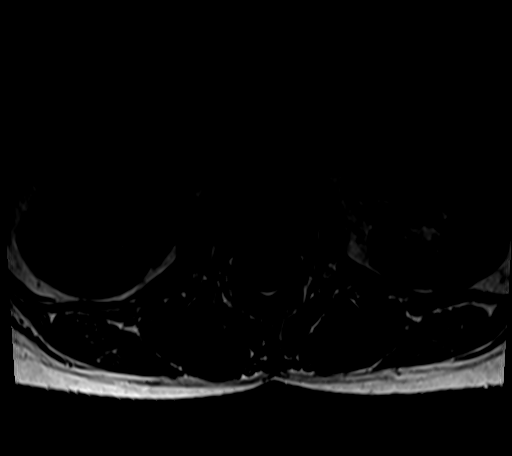

[25 of 48 positions shown; findings below may reference images not displayed]

FINDINGS: Segmentation:  Standard

Alignment: Grade 1 retrolisthesis at L3-4 and grade 1
anterolisthesis at L4-5

Vertebrae:  No fracture, evidence of discitis, or bone lesion.

Conus medullaris and cauda equina: Conus extends to the L1 level.
Conus and cauda equina appear normal.

Paraspinal and other soft tissues: Negative

Disc levels:

L1-L2: Normal disc space and facet joints. No spinal canal stenosis.
No neural foraminal stenosis.

L2-L3: Unchanged left asymmetric disc bulge. Unchanged left lateral
recess narrowing without central spinal canal stenosis. No neural
foraminal stenosis.

L3-L4: Normal disc space and facet joints. No spinal canal stenosis.
No neural foraminal stenosis.

L4-L5: Unchanged mild facet hypertrophy with small disc bulge. No
spinal canal stenosis. No neural foraminal stenosis.

L5-S1: Normal disc space and facet joints. No spinal canal stenosis.
No neural foraminal stenosis.

Visualized sacrum: Normal.
IMPRESSION: 1. Unchanged examination of the lumbar spine with left lateral
recess narrowing at L2-3. Correlate for left L3 radiculopathy.
2. No spinal canal or neural foraminal stenosis.

## 2021-12-25 ENCOUNTER — Other Ambulatory Visit: Payer: Self-pay | Admitting: Orthopedic Surgery

## 2021-12-25 DIAGNOSIS — M545 Low back pain, unspecified: Secondary | ICD-10-CM

## 2022-01-02 ENCOUNTER — Ambulatory Visit
Admission: RE | Admit: 2022-01-02 | Discharge: 2022-01-02 | Disposition: A | Discharge status: Home / Self Care | Payer: BC Managed Care – PPO | Source: Ambulatory Visit | Attending: Orthopedic Surgery | Admitting: Orthopedic Surgery

## 2022-01-02 DIAGNOSIS — M545 Low back pain, unspecified: Secondary | ICD-10-CM

## 2022-01-02 MED ORDER — IOPAMIDOL (ISOVUE-M 200) INJECTION 41%: 10.0000 mL | Freq: Once | INTRAMUSCULAR | Status: DC | PRN

## 2022-01-18 ENCOUNTER — Other Ambulatory Visit: Payer: BC Managed Care – PPO

## 2022-03-13 ENCOUNTER — Other Ambulatory Visit: Payer: Self-pay | Admitting: Orthopedic Surgery

## 2022-03-13 DIAGNOSIS — M545 Low back pain, unspecified: Secondary | ICD-10-CM

## 2022-03-21 ENCOUNTER — Ambulatory Visit
Admission: RE | Admit: 2022-03-21 | Discharge: 2022-03-21 | Disposition: A | Discharge status: Home / Self Care | Payer: BC Managed Care – PPO | Source: Ambulatory Visit | Attending: Orthopedic Surgery | Admitting: Orthopedic Surgery

## 2022-03-21 DIAGNOSIS — M545 Low back pain, unspecified: Secondary | ICD-10-CM

## 2022-03-21 MED ORDER — METHYLPREDNISOLONE ACETATE 40 MG/ML INJ SUSP (RADIOLOG: 120.0000 mg | Freq: Once | INTRAMUSCULAR | Status: DC
